# Patient Record
Sex: Female | Born: 1980 | Race: White | Hispanic: Yes | Marital: Married | Smoking: Current every day smoker
Health system: Southern US, Community
[De-identification: ages and names within clinical notes are randomized; demographics above are authoritative.]

## PROBLEM LIST (undated history)

## (undated) DIAGNOSIS — E669 Obesity, unspecified: Secondary | ICD-10-CM

## (undated) DIAGNOSIS — R7303 Prediabetes: Secondary | ICD-10-CM

## (undated) HISTORY — PX: CHOLECYSTECTOMY: SHX55

## (undated) HISTORY — PX: CARPAL TUNNEL RELEASE: SHX101

## (undated) HISTORY — DX: Obesity, unspecified: E66.9

## (undated) HISTORY — DX: Prediabetes: R73.03

---

## 2002-01-15 ENCOUNTER — Emergency Department (HOSPITAL_COMMUNITY): Admission: EM | Admit: 2002-01-15 | Discharge: 2002-01-16 | Payer: Self-pay | Admitting: Emergency Medicine

## 2002-02-08 ENCOUNTER — Ambulatory Visit (HOSPITAL_COMMUNITY): Admission: RE | Admit: 2002-02-08 | Discharge: 2002-02-08 | Payer: Self-pay | Admitting: *Deleted

## 2002-02-08 ENCOUNTER — Encounter: Payer: Self-pay | Admitting: *Deleted

## 2002-05-19 ENCOUNTER — Ambulatory Visit (HOSPITAL_COMMUNITY): Admission: RE | Admit: 2002-05-19 | Discharge: 2002-05-19 | Payer: Self-pay | Admitting: *Deleted

## 2002-05-19 ENCOUNTER — Encounter: Payer: Self-pay | Admitting: *Deleted

## 2002-06-15 ENCOUNTER — Ambulatory Visit (HOSPITAL_COMMUNITY): Admission: AD | Admit: 2002-06-15 | Discharge: 2002-06-15 | Payer: Self-pay | Admitting: *Deleted

## 2002-06-24 ENCOUNTER — Ambulatory Visit (HOSPITAL_COMMUNITY): Admission: AD | Admit: 2002-06-24 | Discharge: 2002-06-24 | Payer: Self-pay | Admitting: *Deleted

## 2002-06-27 ENCOUNTER — Inpatient Hospital Stay (HOSPITAL_COMMUNITY): Admission: AD | Admit: 2002-06-27 | Discharge: 2002-06-29 | Payer: Self-pay | Admitting: *Deleted

## 2002-10-25 ENCOUNTER — Emergency Department (HOSPITAL_COMMUNITY): Admission: EM | Admit: 2002-10-25 | Discharge: 2002-10-25 | Payer: Self-pay | Admitting: Emergency Medicine

## 2002-12-01 ENCOUNTER — Emergency Department (HOSPITAL_COMMUNITY): Admission: EM | Admit: 2002-12-01 | Discharge: 2002-12-01 | Payer: Self-pay | Admitting: Internal Medicine

## 2003-01-18 ENCOUNTER — Emergency Department (HOSPITAL_COMMUNITY): Admission: EM | Admit: 2003-01-18 | Discharge: 2003-01-18 | Payer: Self-pay | Admitting: Emergency Medicine

## 2003-02-08 ENCOUNTER — Emergency Department (HOSPITAL_COMMUNITY): Admission: EM | Admit: 2003-02-08 | Discharge: 2003-02-09 | Payer: Self-pay | Admitting: Emergency Medicine

## 2003-02-09 ENCOUNTER — Ambulatory Visit (HOSPITAL_COMMUNITY): Admission: RE | Admit: 2003-02-09 | Discharge: 2003-02-09 | Payer: Self-pay | Admitting: Emergency Medicine

## 2003-02-09 ENCOUNTER — Encounter: Payer: Self-pay | Admitting: Emergency Medicine

## 2003-02-11 ENCOUNTER — Emergency Department (HOSPITAL_COMMUNITY): Admission: EM | Admit: 2003-02-11 | Discharge: 2003-02-11 | Payer: Self-pay | Admitting: Internal Medicine

## 2003-02-13 ENCOUNTER — Emergency Department (HOSPITAL_COMMUNITY): Admission: EM | Admit: 2003-02-13 | Discharge: 2003-02-13 | Payer: Self-pay | Admitting: Emergency Medicine

## 2003-02-15 ENCOUNTER — Ambulatory Visit (HOSPITAL_COMMUNITY): Admission: RE | Admit: 2003-02-15 | Discharge: 2003-02-15 | Payer: Self-pay | Admitting: Family Medicine

## 2003-02-15 ENCOUNTER — Encounter: Payer: Self-pay | Admitting: Family Medicine

## 2003-03-14 ENCOUNTER — Inpatient Hospital Stay (HOSPITAL_COMMUNITY): Admission: RE | Admit: 2003-03-14 | Discharge: 2003-03-15 | Payer: Self-pay | Admitting: General Surgery

## 2003-03-23 ENCOUNTER — Emergency Department (HOSPITAL_COMMUNITY): Admission: EM | Admit: 2003-03-23 | Discharge: 2003-03-24 | Payer: Self-pay | Admitting: Emergency Medicine

## 2003-06-01 ENCOUNTER — Emergency Department (HOSPITAL_COMMUNITY): Admission: EM | Admit: 2003-06-01 | Discharge: 2003-06-01 | Payer: Self-pay | Admitting: Emergency Medicine

## 2003-06-21 ENCOUNTER — Emergency Department (HOSPITAL_COMMUNITY): Admission: EM | Admit: 2003-06-21 | Discharge: 2003-06-22 | Payer: Self-pay | Admitting: Emergency Medicine

## 2003-10-03 ENCOUNTER — Emergency Department (HOSPITAL_COMMUNITY): Admission: EM | Admit: 2003-10-03 | Discharge: 2003-10-03 | Payer: Self-pay | Admitting: Emergency Medicine

## 2003-10-03 ENCOUNTER — Ambulatory Visit (HOSPITAL_COMMUNITY): Admission: RE | Admit: 2003-10-03 | Discharge: 2003-10-03 | Payer: Self-pay | Admitting: Emergency Medicine

## 2003-10-17 ENCOUNTER — Emergency Department (HOSPITAL_COMMUNITY): Admission: EM | Admit: 2003-10-17 | Discharge: 2003-10-18 | Payer: Self-pay | Admitting: Emergency Medicine

## 2004-01-29 ENCOUNTER — Emergency Department (HOSPITAL_COMMUNITY): Admission: EM | Admit: 2004-01-29 | Discharge: 2004-01-30 | Payer: Self-pay | Admitting: *Deleted

## 2004-04-15 ENCOUNTER — Emergency Department (HOSPITAL_COMMUNITY): Admission: EM | Admit: 2004-04-15 | Discharge: 2004-04-15 | Payer: Self-pay | Admitting: Emergency Medicine

## 2004-05-13 ENCOUNTER — Emergency Department (HOSPITAL_COMMUNITY): Admission: EM | Admit: 2004-05-13 | Discharge: 2004-05-13 | Payer: Self-pay | Admitting: Emergency Medicine

## 2004-06-04 ENCOUNTER — Emergency Department (HOSPITAL_COMMUNITY): Admission: EM | Admit: 2004-06-04 | Discharge: 2004-06-04 | Payer: Self-pay | Admitting: Emergency Medicine

## 2004-07-26 ENCOUNTER — Emergency Department (HOSPITAL_COMMUNITY): Admission: EM | Admit: 2004-07-26 | Discharge: 2004-07-27 | Payer: Self-pay | Admitting: Emergency Medicine

## 2004-08-08 ENCOUNTER — Emergency Department (HOSPITAL_COMMUNITY): Admission: EM | Admit: 2004-08-08 | Discharge: 2004-08-08 | Payer: Self-pay | Admitting: Emergency Medicine

## 2005-04-04 ENCOUNTER — Emergency Department (HOSPITAL_COMMUNITY): Admission: EM | Admit: 2005-04-04 | Discharge: 2005-04-04 | Payer: Self-pay | Admitting: Emergency Medicine

## 2005-04-19 ENCOUNTER — Emergency Department (HOSPITAL_COMMUNITY): Admission: EM | Admit: 2005-04-19 | Discharge: 2005-04-19 | Payer: Self-pay | Admitting: Emergency Medicine

## 2005-06-05 ENCOUNTER — Emergency Department (HOSPITAL_COMMUNITY): Admission: EM | Admit: 2005-06-05 | Discharge: 2005-06-05 | Payer: Self-pay | Admitting: Emergency Medicine

## 2005-06-29 ENCOUNTER — Emergency Department (HOSPITAL_COMMUNITY): Admission: EM | Admit: 2005-06-29 | Discharge: 2005-06-29 | Payer: Self-pay | Admitting: Emergency Medicine

## 2005-09-04 ENCOUNTER — Ambulatory Visit (HOSPITAL_COMMUNITY): Admission: RE | Admit: 2005-09-04 | Discharge: 2005-09-04 | Payer: Self-pay | Admitting: Obstetrics and Gynecology

## 2005-09-14 ENCOUNTER — Emergency Department (HOSPITAL_COMMUNITY): Admission: EM | Admit: 2005-09-14 | Discharge: 2005-09-14 | Payer: Self-pay | Admitting: Emergency Medicine

## 2005-09-16 ENCOUNTER — Emergency Department (HOSPITAL_COMMUNITY): Admission: EM | Admit: 2005-09-16 | Discharge: 2005-09-16 | Payer: Self-pay | Admitting: Emergency Medicine

## 2005-10-09 ENCOUNTER — Emergency Department (HOSPITAL_COMMUNITY): Admission: EM | Admit: 2005-10-09 | Discharge: 2005-10-09 | Payer: Self-pay | Admitting: Emergency Medicine

## 2005-11-17 ENCOUNTER — Ambulatory Visit: Payer: Self-pay | Admitting: Orthopedic Surgery

## 2005-12-22 ENCOUNTER — Emergency Department (HOSPITAL_COMMUNITY): Admission: EM | Admit: 2005-12-22 | Discharge: 2005-12-22 | Payer: Self-pay | Admitting: Emergency Medicine

## 2005-12-29 ENCOUNTER — Ambulatory Visit: Payer: Self-pay | Admitting: Orthopedic Surgery

## 2006-01-12 ENCOUNTER — Ambulatory Visit: Payer: Self-pay | Admitting: Orthopedic Surgery

## 2006-03-19 ENCOUNTER — Ambulatory Visit: Payer: Self-pay | Admitting: Orthopedic Surgery

## 2006-04-03 ENCOUNTER — Ambulatory Visit: Payer: Self-pay | Admitting: Orthopedic Surgery

## 2006-04-03 ENCOUNTER — Ambulatory Visit (HOSPITAL_COMMUNITY): Admission: RE | Admit: 2006-04-03 | Discharge: 2006-04-03 | Payer: Self-pay | Admitting: Orthopedic Surgery

## 2006-04-07 ENCOUNTER — Ambulatory Visit: Payer: Self-pay | Admitting: Orthopedic Surgery

## 2006-04-15 ENCOUNTER — Ambulatory Visit: Payer: Self-pay | Admitting: Orthopedic Surgery

## 2006-05-28 ENCOUNTER — Ambulatory Visit: Payer: Self-pay | Admitting: Orthopedic Surgery

## 2006-07-16 ENCOUNTER — Emergency Department (HOSPITAL_COMMUNITY): Admission: EM | Admit: 2006-07-16 | Discharge: 2006-07-16 | Payer: Self-pay | Admitting: Emergency Medicine

## 2006-10-15 ENCOUNTER — Ambulatory Visit (HOSPITAL_COMMUNITY): Admission: AD | Admit: 2006-10-15 | Discharge: 2006-10-15 | Payer: Self-pay | Admitting: Obstetrics and Gynecology

## 2006-11-21 ENCOUNTER — Ambulatory Visit: Payer: Self-pay | Admitting: Obstetrics and Gynecology

## 2006-11-21 ENCOUNTER — Inpatient Hospital Stay (HOSPITAL_COMMUNITY): Admission: AD | Admit: 2006-11-21 | Discharge: 2006-11-21 | Payer: Self-pay | Admitting: Obstetrics and Gynecology

## 2006-11-29 ENCOUNTER — Inpatient Hospital Stay (HOSPITAL_COMMUNITY): Admission: AD | Admit: 2006-11-29 | Discharge: 2006-11-29 | Payer: Self-pay | Admitting: Obstetrics & Gynecology

## 2006-12-03 ENCOUNTER — Inpatient Hospital Stay (HOSPITAL_COMMUNITY): Admission: AD | Admit: 2006-12-03 | Discharge: 2006-12-03 | Payer: Self-pay | Admitting: Obstetrics and Gynecology

## 2006-12-03 ENCOUNTER — Ambulatory Visit: Payer: Self-pay | Admitting: Obstetrics and Gynecology

## 2006-12-05 ENCOUNTER — Ambulatory Visit: Payer: Self-pay | Admitting: Obstetrics and Gynecology

## 2006-12-05 ENCOUNTER — Inpatient Hospital Stay (HOSPITAL_COMMUNITY): Admission: AD | Admit: 2006-12-05 | Discharge: 2006-12-06 | Payer: Self-pay | Admitting: Obstetrics and Gynecology

## 2007-03-08 ENCOUNTER — Emergency Department (HOSPITAL_COMMUNITY): Admission: EM | Admit: 2007-03-08 | Discharge: 2007-03-08 | Payer: Self-pay | Admitting: Emergency Medicine

## 2008-03-15 ENCOUNTER — Ambulatory Visit (HOSPITAL_COMMUNITY): Admission: RE | Admit: 2008-03-15 | Discharge: 2008-03-15 | Payer: Self-pay | Admitting: Obstetrics & Gynecology

## 2008-06-26 ENCOUNTER — Emergency Department (HOSPITAL_COMMUNITY): Admission: EM | Admit: 2008-06-26 | Discharge: 2008-06-26 | Payer: Self-pay | Admitting: Emergency Medicine

## 2009-02-22 ENCOUNTER — Encounter (INDEPENDENT_AMBULATORY_CARE_PROVIDER_SITE_OTHER): Payer: Self-pay | Admitting: *Deleted

## 2009-03-21 ENCOUNTER — Ambulatory Visit: Payer: Self-pay | Admitting: Gastroenterology

## 2009-03-21 DIAGNOSIS — K219 Gastro-esophageal reflux disease without esophagitis: Secondary | ICD-10-CM

## 2009-03-21 DIAGNOSIS — R197 Diarrhea, unspecified: Secondary | ICD-10-CM | POA: Insufficient documentation

## 2009-03-22 ENCOUNTER — Encounter: Payer: Self-pay | Admitting: Internal Medicine

## 2009-03-26 ENCOUNTER — Ambulatory Visit (HOSPITAL_COMMUNITY): Admission: RE | Admit: 2009-03-26 | Discharge: 2009-03-26 | Payer: Self-pay | Admitting: Internal Medicine

## 2009-03-26 ENCOUNTER — Ambulatory Visit: Payer: Self-pay | Admitting: Internal Medicine

## 2009-05-09 ENCOUNTER — Ambulatory Visit: Payer: Self-pay | Admitting: Internal Medicine

## 2009-05-09 DIAGNOSIS — R112 Nausea with vomiting, unspecified: Secondary | ICD-10-CM

## 2009-05-09 DIAGNOSIS — R1013 Epigastric pain: Secondary | ICD-10-CM | POA: Insufficient documentation

## 2009-05-09 DIAGNOSIS — R1310 Dysphagia, unspecified: Secondary | ICD-10-CM | POA: Insufficient documentation

## 2009-05-09 DIAGNOSIS — R1011 Right upper quadrant pain: Secondary | ICD-10-CM | POA: Insufficient documentation

## 2009-05-17 ENCOUNTER — Encounter: Payer: Self-pay | Admitting: Gastroenterology

## 2009-05-18 ENCOUNTER — Ambulatory Visit (HOSPITAL_COMMUNITY): Admission: RE | Admit: 2009-05-18 | Discharge: 2009-05-18 | Payer: Self-pay | Admitting: Internal Medicine

## 2009-05-18 LAB — CONVERTED CEMR LAB
ALT: 30 units/L (ref 0–35)
AST: 21 units/L (ref 0–37)
Bilirubin, Direct: <0.1 mg/dL (ref 0.0–0.3)
Eosinophils Relative: 1 % (ref 0–5)
HCT: 41.6 % (ref 36.0–46.0)
Hemoglobin: 13.8 g/dL (ref 12.0–15.0)
Lymphocytes Relative: 42 % (ref 12–46)
Lymphs Abs: 3.5 10*3/uL (ref 0.7–4.0)
Monocytes Absolute: 0.4 10*3/uL (ref 0.1–1.0)
RDW: 13.2 % (ref 11.5–15.5)

## 2009-05-22 ENCOUNTER — Encounter: Payer: Self-pay | Admitting: Internal Medicine

## 2009-05-25 ENCOUNTER — Encounter: Payer: Self-pay | Admitting: Internal Medicine

## 2009-05-30 ENCOUNTER — Encounter: Payer: Self-pay | Admitting: Internal Medicine

## 2009-08-29 ENCOUNTER — Encounter: Payer: Self-pay | Admitting: Internal Medicine

## 2010-04-11 IMAGING — CT CT ABD-PELV W/ CM
2 of 3 series · 17 of 46 positions shown, 19 images · IV contrast (Omnipaque 300)
Comparison: None

CLINICAL DATA: Right flank and abdominal pain.  Nausea and
vomiting.

CT ABDOMEN AND PELVIS WITH CONTRAST
TECHNIQUE: Multidetector CT imaging of the abdomen and pelvis was
performed following the standard protocol during bolus
administration of intravenous contrast.
Contrast: 100 ml 5mnipaque-6MM

[Series 2: abd_pel_with 5.0 b40f · axial · 0.64mm/px · z∈[-453,-68]mm · 14 of 89 slices shown, 16 images]
[im 6/89  soft-tissue]
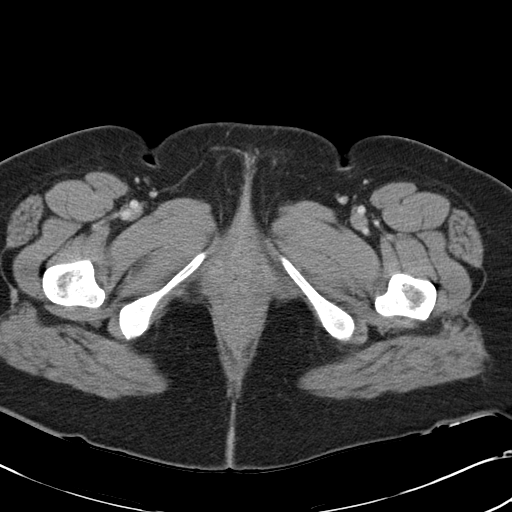
[im 6/89  bone]
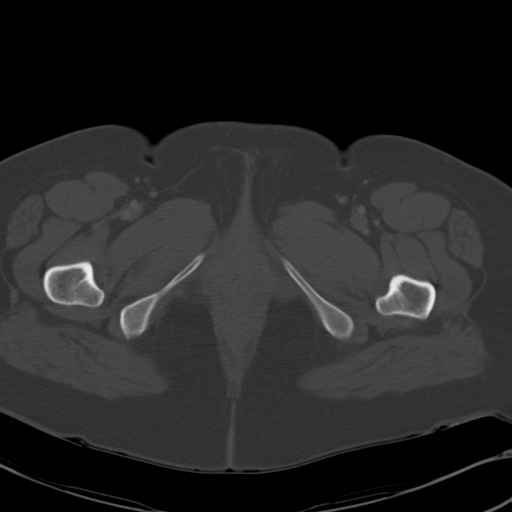
[im 12/89  soft-tissue]
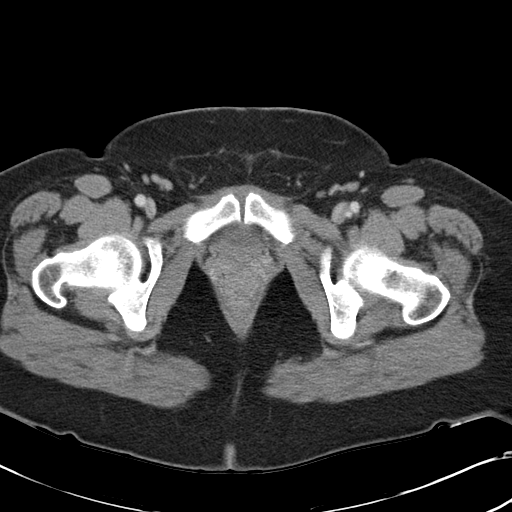
[im 18/89  soft-tissue]
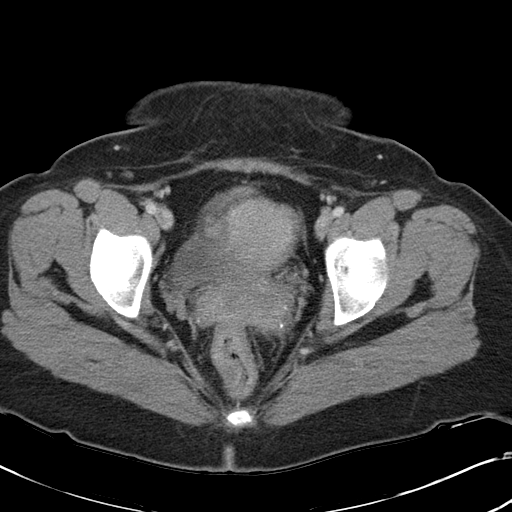
[im 23/89  soft-tissue]
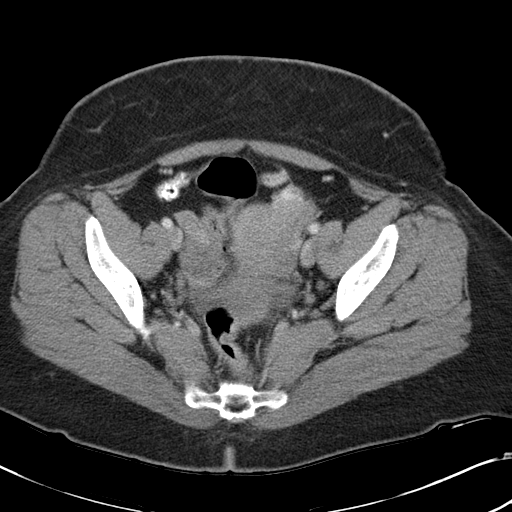
[im 29/89  soft-tissue]
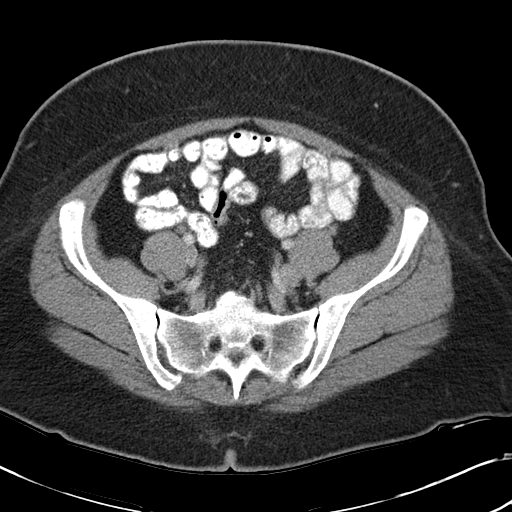
[im 35/89  soft-tissue]
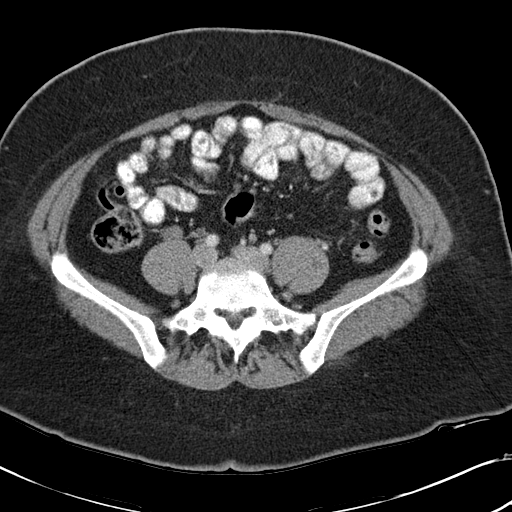
[im 40/89  soft-tissue]
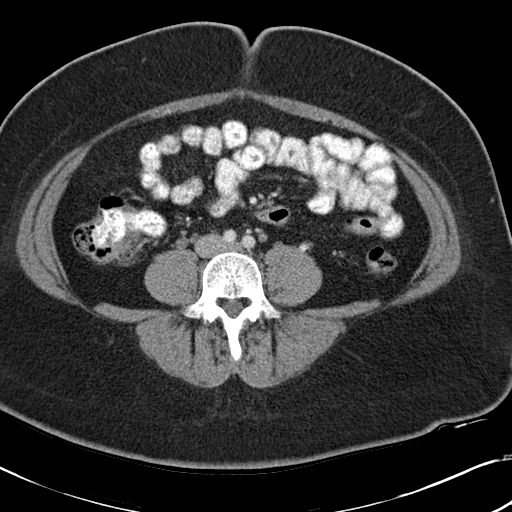
[im 49/89  soft-tissue]
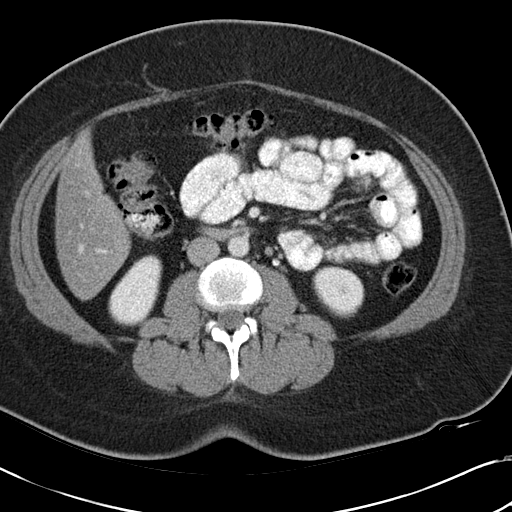
[im 54/89  soft-tissue]
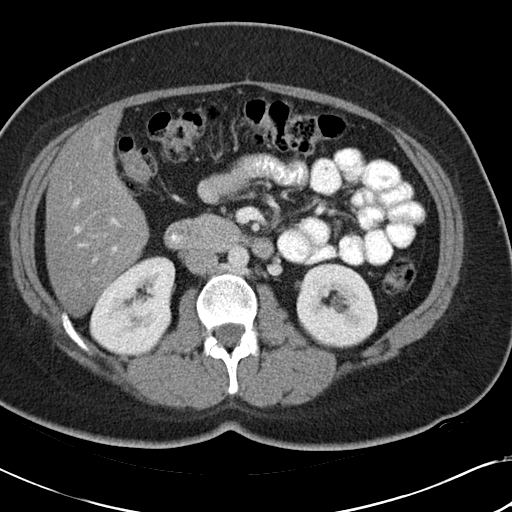
[im 54/89  bone]
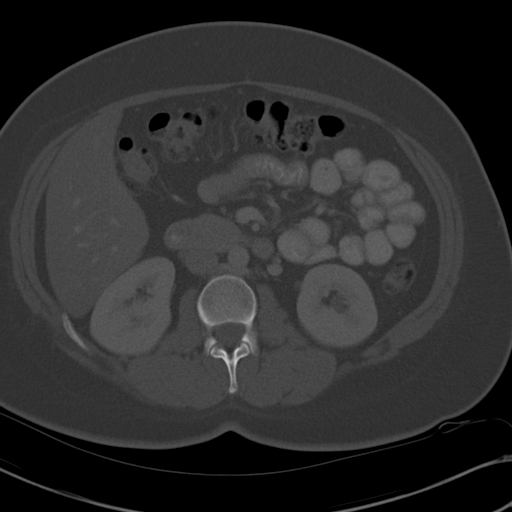
[im 60/89  soft-tissue]
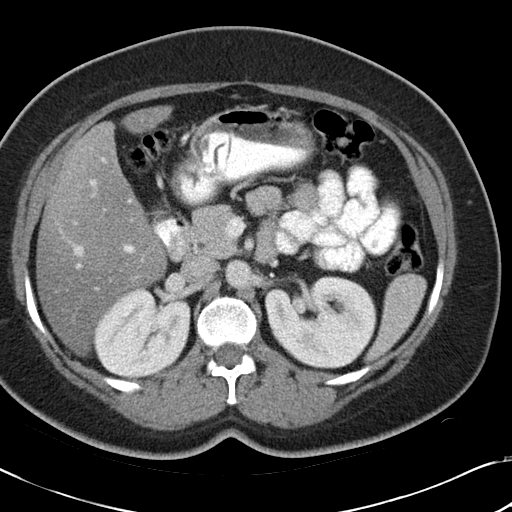
[im 66/89  soft-tissue]
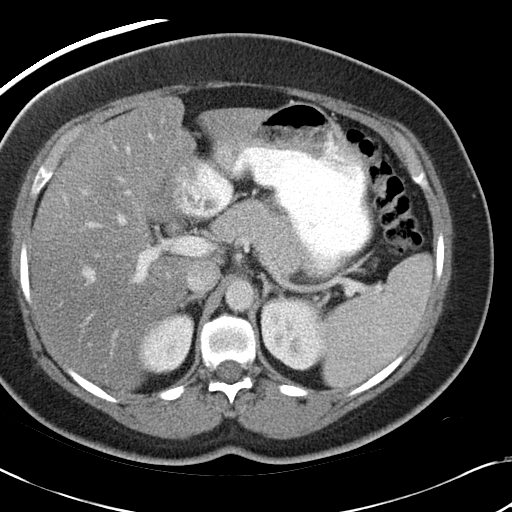
[im 71/89  soft-tissue]
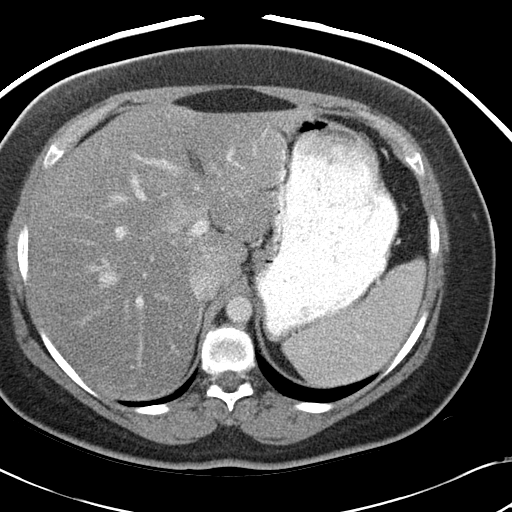
[im 77/89  soft-tissue]
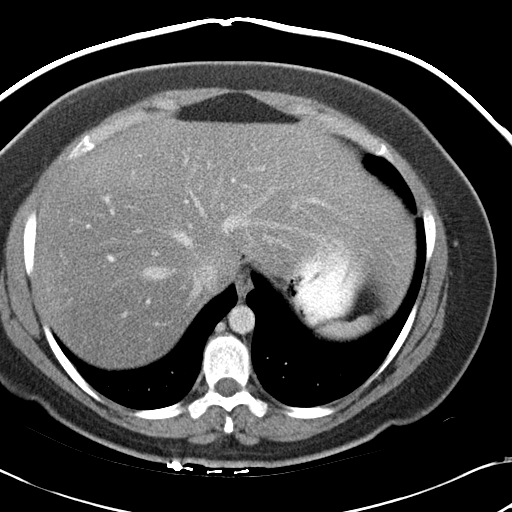
[im 83/89  soft-tissue]
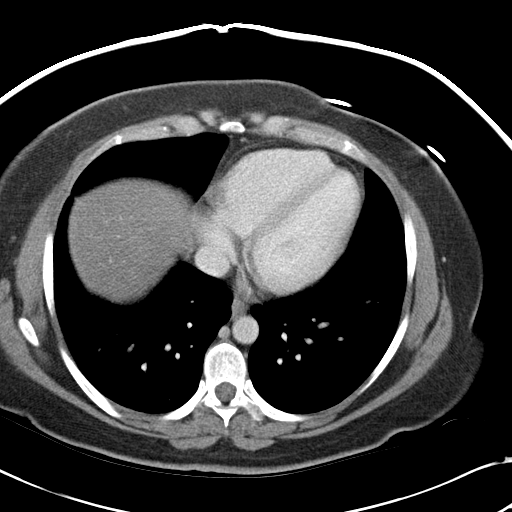

[Series 4: mpr cor post contrast (id) · coronal · 0.79mm/px · 3 of 93 slices shown]
[im 31/93  soft-tissue]
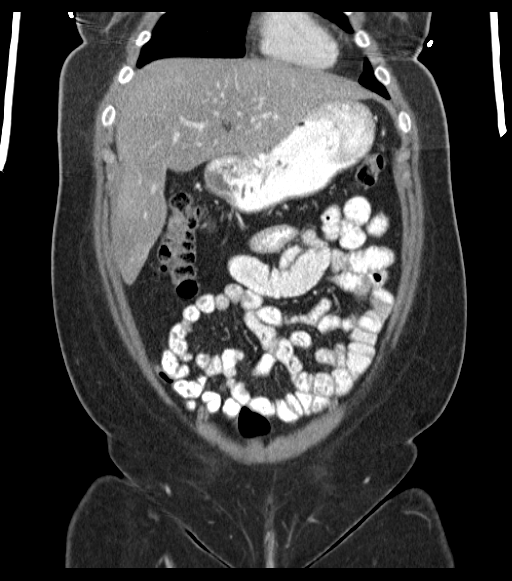
[im 41/93  soft-tissue]
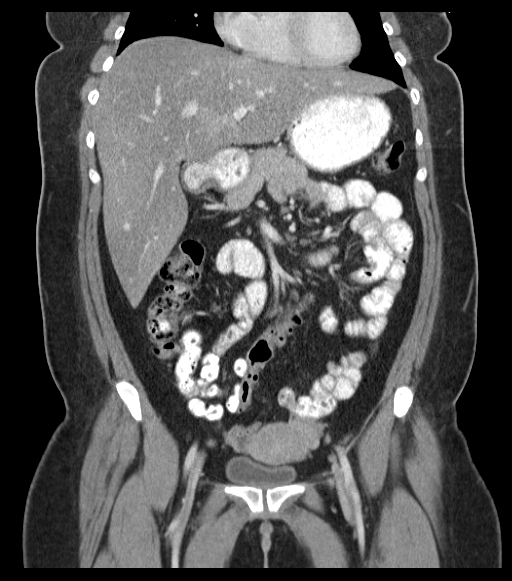
[im 52/93  soft-tissue]
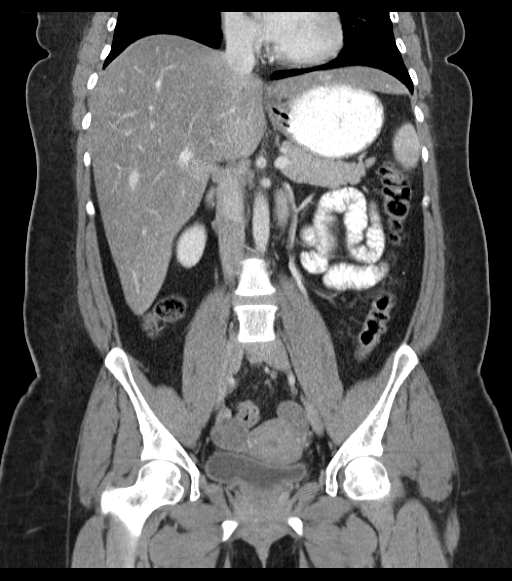

[17 of 46 positions shown; findings below may reference images not displayed]

FINDINGS: Diffuse hepatic steatosis is present.  There is some
minimal focal fatty sparing in the medial segment left hepatic lobe
along the porta hepatis.  The gallbladder is surgically absent.

The spleen, pancreas, adrenal glands, and stomach appear
unremarkable.

The kidneys appear unremarkable, as do the proximal ureters.

No pathologic retroperitoneal or porta hepatis adenopathy is
identified.

The appendix appears normal.  No dilated bowel identified.

Urinary bladder appears normal, as does the uterus.  Ovarian
contours likewise appear normal.

No free pelvic fluid identified. No pathologic pelvic adenopathy is
identified.

There appears to be a transitional S1 vertebra.

No ureteral calculus identified.
IMPRESSION: 1.  Diffuse hepatic steatosis.
2.  No acute findings.

## 2010-05-26 ENCOUNTER — Encounter: Payer: Self-pay | Admitting: Obstetrics and Gynecology

## 2010-06-04 NOTE — Letter (Signed)
Summary: External Other  External Other   Imported By: Peggyann Shoals 08/29/2009 10:21:04  _____________________________________________________________________  External Attachment:    Type:   Image     Comment:   External Document

## 2010-06-04 NOTE — Letter (Signed)
Summary: [facility]   Imported By: Diana Eves 05/30/2009 15:36:29  _____________________________________________________________________  External Attachment:    Type:   Image     Comment:   External Document  Appended Document: [facility] CALLED AND SAID THEY WERE UNABLE TO RETRIEVE THE DATA OFF THE MEMORY CARD. THEY HAD TO SEND IT TO THE COMPANY TO GET IT, AND THEY WILL HAVE [place] A REPORT WHEN THEY GET IT BACK.

## 2010-06-04 NOTE — Letter (Signed)
Summary: [facility] REFERRAL  [facility] REFERRAL   Imported By: Ave Filter 05/25/2009 12:40:15  _____________________________________________________________________  External Attachment:    Type:   Image     Comment:   External Document

## 2010-06-04 NOTE — Assessment & Plan Note (Signed)
Summary: follow up from procedure- cdg   Visit Type:  f/u Primary Care Provider:  Dr. Acey Lav  Chief Complaint:  follow up from procedure, hurting in chest started this week, and having nausea and vomiting.  History of Present Illness: [name] is a pleasant 30 year old white female who presents for f/u of recent upper endoscopy done for refractory reflux-like symptoms. On EGD she had a noncritical Schatzki ring, small hiatal hernia but otherwise unremarkable study.  She has noted no improvement on Aciphex.  She c/o pain and burning in upper abdomen which radiates into the chest. She c/o N/V several times over the past few days.  C/O odynophagia with swallowing food or water.  No hematemesis.  BMs less frequent with hyomax.  Denies melena, brbpr.    Current Medications (verified): 1)  Aciphex 20 Mg Tbec (Rabeprazole Sodium) .... One By Mouth Daily 2)  Zyrtec Hives Relief 10 Mg Tabs (Cetirizine Hcl) .... Take 1 Tablet By Mouth Once A Day As Needed 3)  Hyomax-Sr 0.375 Mg Xr12h-Tab (Hyoscyamine Sulfate) .... One By Mouth Two Times A Day For Diarrhea  Allergies (verified): 1)  ! Hydrocodone  Review of Systems      See HPI CV:  Denies chest pains, angina, and palpitations. Resp:  Denies dyspnea at rest, dyspnea with exercise, cough, sputum, and wheezing.  Vital Signs:  Patient profile:   30 year old female Height:      55 inches Weight:      165 pounds BMI:     38.49 Temp:     98.5 degrees F oral Pulse rate:   80 / minute BP sitting:   104 / 70  (left arm) Cuff size:   regular  Vitals Entered By: Hendricks Limes LPN (May 09, 2009 2:41 PM)  Physical Exam  General:  Well developed, well nourished, no acute distress.obese.   Eyes:  Sclera nonicteric. Mouth:  OP moist. Lungs:  Clear throughout to auscultation. Heart:  Regular rate and rhythm; no murmurs, rubs,  or bruits. Abdomen:  Obese. Positive BSs. Diffuse mild tenderness to even light palpation.  Less tenderness  noted with distraction.  Most tenderness in ruq and epigastrium. No abd bruit or hernia. No rebound or guarding. Extremities:  No clubbing, cyanosis, edema or deformities noted. Neurologic:  Alert and  oriented x4;  grossly normal neurologically. Skin:  Intact without significant lesions or rashes. Psych:  Alert and cooperative. Normal mood and affect.  Impression & Recommendations:  Problem # 1:  EPIGASTRIC PAIN (ICD-789.06) Epigastric and ruq abd pain radiating into chest and throat.  Described as pain and burning.  No response to Aciphex, Nexium, Prilosec, Prevacid.  ?refractory gerd or nonacid reflux, eosinophilic esophagitis/gastroenteritis (less likely), ?biliary or pancreatic disease.  If labwork unremarkable, consider referral for pH and impedence study on PPI therapy.  If abd pain, n/v worsen may need to consider CT. Will increase Aciphex to two times a day (samples provided #30) and add Carafate for two weeks.  Problem # 2:  DYSPHAGIA UNSPECIFIED (ICD-787.20) C/O odynophagia.  See #2  Problem # 3:  NAUSEA WITH VOMITING (ICD-787.01) see #1  Problem # 4:  DIARRHEA, CHRONIC (ICD-787.91) ?IBS.  Better on antispasmotic, uses as needed.  Other Orders: Est. Patient Level III ([address]) T-Amylase [phone]) T-Lipase [phone]) T-Hepatic Function [phone]) T-CBC w/Diff [phone]) Prescriptions: SUCRALFATE 1 GM/10ML SUSP (SUCRALFATE) one gram by mouth qac and at bedtime as needed for two weeks  #560 cc x 0   Entered and Authorized  by:   Leanna Battles Dixon Boos   Signed by:   Leanna Battles Zya Finkle PA-C on 05/09/2009   Method used:   Electronically to        [facility]. [phone] (retail)       [address] [place]  [address]       Ph: [phone]       Fax: [phone]   RxID:   [id number]

## 2010-06-04 NOTE — Letter (Signed)
Summary: CT SCAN/CXR ORDER  CT SCAN/CXR ORDER   Imported By: Ave Filter 05/22/2009 12:53:56  _____________________________________________________________________  External Attachment:    Type:   Image     Comment:   External Document

## 2010-09-17 NOTE — Op Note (Signed)
NAMEGildardo Knapp NO.:  [id number]   MEDICAL RECORD NO.:  [id number]          PATIENT TYPE:  OBV   LOCATION:  A415                          FACILITY:  [facility]   PHYSICIAN:  Tilda Burrow, M.D. DATE OF BIRTH:  1980/07/14   DATE OF PROCEDURE:  10/15/2006  DATE OF DISCHARGE:  10/15/2006                               OPERATIVE REPORT   Observation x2 hours.  This 30 year old Hispanic female 2 days after  being seen in the office for some inguinal pain on the right has noted  the pain has increased.  She presents to Labor & Delivery where external  monitoring shows no contractions.  Urinalysis is not done.  It was  negative in the office on June 11.  She has right inguinal pain without  bulge or mass, the femoral triangle is nontender, there is no leg  swelling or pain with movement of leg.  Uterus is nontender.  She was  given a dose of terbutaline due to 3 contractions noted on the monitor  __________ .   IMPRESSION:  Musculoskeletal discomfort only, do not __identify__ deep  vein thrombosis or obstetric problem.   Followup as scheduled.  A prescription given for some Vicodin for  discomfort.  Followup in our office as scheduled, earlier as needed  change in symptoms.      Tilda Burrow, M.D.  Electronically Signed     JVF/MEDQ  D:  10/15/2006  T:  10/16/2006  Job:  [id number]

## 2010-09-17 NOTE — Consult Note (Signed)
NAMESHARITA, BIENAIME NO.:  [id number]   MEDICAL RECORD NO.:  [id number]          PATIENT TYPE:  MAT   LOCATION:  MATC                          FACILITY:  [facility]   PHYSICIAN:  Tilda Burrow, M.D. DATE OF BIRTH:  01-26-1981   DATE OF CONSULTATION:  DATE OF DISCHARGE:                                 CONSULTATION   Tonya Knapp is [redacted] weeks pregnant with her fourth baby, came into the MAU on  November 20, 2006 and was kept for a couple of hours due to complaints of a  lot of pelvic pressure and back pain. She is not having any  contractions. Her cervix was 1 cm, very thick, minus 2 station. Fetal  heart rate was reactive without decelerations.   She was checked again a couple of hours later and there was no change to  her cervix. She was given instructions on palliative measures to help  with the pains of pregnancy such as a maternity belt, a warm bath,  Tylenol. She has been taking Vicodin p.r.n. for the same type of pain.  She states that she is just sick of taking medicine, so she does not  want anything else.      Tonya Knapp, C.N.M.      Tilda Burrow, M.D.  Electronically Signed    FC/MEDQ  D:  11/21/2006  T:  11/21/2006  Job:  [id number]

## 2010-09-20 NOTE — H&P (Signed)
NAME:  Tonya Knapp, WALCZYK NO.:  [id number]   MEDICAL RECORD NO.:  [id number]                  PATIENT TYPE:   LOCATION:                                       FACILITY:   PHYSICIAN:  Langley Gauss, M.D.                DATE OF BIRTH:   DATE OF ADMISSION:  06/27/2002  DATE OF DISCHARGE:                                HISTORY & PHYSICAL   HISTORY OF PRESENT ILLNESS:  This is a 30 year old gravida 3, para 2 at 54-  1/[redacted] weeks gestation, who is seen in the office for a scheduled visit today,  at which time she complains of pelvic and perineal pressure and on  examination, has been noted to make cervical change to 3 cm dilated,  70%  effaced, minus 1 station and vertex presentation.  She is also referred to  [facility] with a diagnosis of early labor.  She does have other  childcare responsibilities and thus, will not be able to go immediately to  labor and delivery.   PRENATAL COURSE:  The patient's prenatal course has been uncomplicated.  The  culture is negative.  AFB triple screen within normal limits.  Glucose  tolerance test normal, two previous pregnancies, thus not repeated during  this pregnancy.  She is O negative blood type and did receive RhoGAM  injection on 05/03/02.  The patient does have serial ultrasound which have  documented adequate fetal growth and normal anatomic survey.   PAST OB HISTORY:  Vaginal delivery at [redacted] weeks gestation of a 5 pound, 4.1  ounce infant.  Vaginal delivery at [redacted] weeks gestation of a 5 pound, 1.1  ounce infant.  The patient had no problems with either prenatal course,  pregnancy or delivery.   PAST MEDICAL/SURGICAL HISTORY:  Otherwise, negative.   ALLERGIES:  No known drug allergies.   CURRENT MEDICATIONS:  Prenatal vitamins.   SOCIAL HISTORY:  The patient was employed at the onset of the pregnancy at  [employer].  She is currently not working.  She did smoke less than one  half pack per day at  the onset of the pregnancy planned on quitting during  the pregnancy.   PHYSICAL EXAMINATION:  GENERAL:  In no acute distress.  Height is 4 feet, 7  inches.  Pregnancy weight 146; most recent weight 147.  VITAL SIGNS:  Blood pressure 110/76, pulse rate 80, respiratory rate 20.  HEENT:  Negative, no adenopathy.  NECK:  Supple.  Thyroid is nonpalpable.  LUNGS:  Clear.  CARDIOVASCULAR:  Regular rate and rhythm.  ABDOMEN:  Soft and nontender.  No surgical scars were identified.  She is  vertex presentation by Leopold's maneuver.  Fundal height is 34 cm.  Heart  rate auscultated at 152.  EXTREMITIES:  Normal with no significant edema.  PELVIC:  Normal external genitalia.  No lesions or ulcerations identified.  No fluid or vaginal bleeding.  Cervix as stated previously, 3 cm dilated.  Pelvis is known to be clinically adequate by clinical pelvimetry.   ASSESSMENT AND PLAN:  Gravida 3, para 2 with very acceptable cervix.  The  patient likely in early stages of labor.  Referred to [facility],  at which time she will be admitted and monitoring performed.  Thereafter,  amniotomy performed.  The patient will thereafter be monitored for adequacy  of uterine contractions and cervical change.  The patient does plan on  breastfeeding.  She would like to utilize the patch for postpartum birth  control purposes.  She had previously done well on contraceptive patch and  done well.  She plans on utilizing Dr. Sudie Bailey for newborn pediatric care.                                               Langley Gauss, M.D.    DC/MEDQ  D:  06/27/2002  T:  06/27/2002  Job:  [id number]

## 2010-09-20 NOTE — Op Note (Signed)
   NAME:  Tonya Knapp                       ACCOUNT NO.:  [id number]   MEDICAL RECORD NO.:  [id number]                   PATIENT TYPE:  INP   LOCATION:  A330                                 FACILITY:  [facility]   PHYSICIAN:  Dirk Dress. Katrinka Blazing, M.D.                DATE OF BIRTH:  August 23, 1980   DATE OF PROCEDURE:  DATE OF DISCHARGE:                                 OPERATIVE REPORT   PREOPERATIVE DIAGNOSES:  1. Cholelithiasis.  2. Cholecystitis.   POSTOPERATIVE DIAGNOSES:  1. Cholelithiasis.  2. Cholecystitis.   PROCEDURE:  Laparoscopic cholecystectomy.   SURGEON:  Dirk Dress. Katrinka Blazing, M.D.   DESCRIPTION:  Under general anesthesia the patient's abdomen was prepped and  draped in a sterile field.  A supraumbilical incision was made and a Veress  needle was then inserted uneventfully.  The abdomen was insufflated with 3 L  of CO2.  Using a Visiport guide, a 10-mm port was placed uneventfully.  A  laparoscope was placed, and the gallbladder was visualized.  Under  videoscopic guidance a 10-mm port and two 5-mm ports were placed in the  right upper quadrant.  The gallbladder was grasped by the assistant and  positioned.  The cystic duct was dissected, clipped with four clips, and  divided.  The cystic artery had two branches.  Each was clipped with three  clips and divided.  Using electrocautery, the gallbladder was then separated  from the infrahepatic space without difficulty.  The gallbladder was grasped  and retrieved.  There was no bleeding or bile leak from the bed.  The fluid  was totally clear with irrigation.  The CO2 was allowed to escape from the  abdomen, and the ports were removed.  The two larger incisions were closed  with 0-Dexon on the fascia, and all incisions were closed with staples on  the skin.  The patient tolerated the procedure well.  She was awakened from  anesthesia uneventfully, transferred to a bed, and taken to the post  anesthesia care unit.      ___________________________________________                                            Dirk Dress. Katrinka Blazing, M.D.   LCS/MEDQ  D:  03/14/2003  T:  03/14/2003  Job:  [id number]   cc:   Mila Homer. Sudie Bailey, M.D.  [address] [place], [place] [address]  Fax: [phone]

## 2010-09-20 NOTE — H&P (Signed)
   NAMEGildardo Knapp NO.:  [id number]   MEDICAL RECORD NO.:  [id number]                  PATIENT TYPE:   LOCATION:                                       FACILITY:  [facility]   PHYSICIAN:  Dirk Dress. Katrinka Blazing, M.D.                DATE OF BIRTH:  13-Mar-1981   DATE OF ADMISSION:  DATE OF DISCHARGE:                                HISTORY & PHYSICAL   HISTORY OF PRESENT ILLNESS:  [age]-year-old female with greater than a  five-year history of recurrent severe abdominal pain.  The pain has been  much more severe over the past three weeks.  She was evaluated and found to  have a gallbladder filled with stones.  She remained asymptomatic with  nausea, vomiting and diarrhea.  The pain radiates through to her back.  Liver functions were normal.  The patient has been referred for  cholecystectomy.   PAST HISTORY:  She has seasonal allergies.   MEDICATIONS:  1. Zyrtec 10 mg daily.  2. Tylenol No. 3 p.r.n. for pain.   ALLERGIES:  No known drug allergies.   FAMILY HISTORY:  Family history is positive for diabetes mellitus, breast  cancer and gallstone disease.   SOCIAL HISTORY:  She is single parent with three children.  She works part-  time in [employer].   PHYSICAL EXAMINATION:  VITAL SIGNS:  Blood pressure 108/82, pulse 78,  respirations 20.  Weight 133 pounds.  HEENT:  Unremarkable.  There is no jaundice.  NECK:  Neck is supple without JVD or bruit.  CHEST:  Chest clear to auscultation.  HEART:  Regular rate and rhythm without murmur, gallop or rub.  ABDOMEN:  Abdomen soft.  Moderate epigastric and right upper quadrant  tenderness.  Normal bowel sounds.  EXTREMITIES:  No cyanosis, clubbing or edema.  NEUROLOGIC:  No focal motor, sensory or cerebellar deficit.   IMPRESSION:  1. Cholelithiasis/cholecystitis.  2. Seasonal allergies.   PLAN:  Laparoscopic cholecystectomy.    ___________________________________________               Dirk Dress Katrinka Blazing, M.D.   LCS/MEDQ  D:  03/13/2003  T:  03/14/2003  Job:  [id number]

## 2010-09-20 NOTE — Discharge Summary (Signed)
   NAME:  Tonya Knapp, Tonya Knapp                       ACCOUNT NO.:  [id number]   MEDICAL RECORD NO.:  [id number]                   PATIENT TYPE:  INP   LOCATION:  A426                                 FACILITY:  [facility]   PHYSICIAN:  Langley Gauss, M.D.                DATE OF BIRTH:  1980/12/12   DATE OF ADMISSION:  06/27/2002  DATE OF DISCHARGE:  06/29/2002                                 DISCHARGE SUMMARY   DIAGNOSIS:  A 38+ week intrauterine pregnancy.   PROCEDURE:  1. Placement of continuous lumbar epidural analgesia on 06/27/2002.  2. On 06/28/2002: Spontaneous assisted vaginal delivery a 6 pound 9 ounce     female infant.  3. Midline episiotomy repair.   DISPOSITION:  At the time of discharge the patient is bottle feeding. She  will follow up in 4-6 weeks time.  She is interested in placement of an IUD  at that time.  She would desire infant circumcision; however, she has made  any financial arrangements for this, thus it is not performed during this  hospital stay, but certainly can be performed within the next 2 weeks as an  outpatient.  The patient is given a copy of the standardized discharge  instructions at the time of discharge. The pediatrician is Dr. John Giovanni.   LABORATORY DATA:  Pertinent laboratory studies:  Admission hemoglobin and  hematocrit 10.5/31.7 with a white count of 8.2, O negative blood type,  antibodies negative.  RhoGAM workup was performed during this  hospitalization.  Postpartum day #1 hemoglobin 10.9, hematocrit 30.0 with a  white count of 14.0.   DISCHARGE MEDICATIONS:  Tylenol #3 and Motrin.   HOSPITAL COURSE:  See previous dictations.  The patient did well with the  epidural, had an uncomplicated delivery, bonded well with the infant and  thus discharged home on postpartum day #1-1/2 in excellent condition and  certain of her desires for placement of an IUD with the full understanding  that this is advantageous to be left in place x10 years  duration.                                               Langley Gauss, M.D.    DC/MEDQ  D:  06/30/2002  T:  06/30/2002  Job:  [id number]

## 2010-09-20 NOTE — Op Note (Signed)
NAME:  Tonya Knapp, Tonya Knapp                       ACCOUNT NO.:  [id number]   MEDICAL RECORD NO.:  [id number]                   PATIENT TYPE:  INP   LOCATION:  A426                                 FACILITY:  [facility]   PHYSICIAN:  Langley Gauss, M.D.                DATE OF BIRTH:  1980-06-11   DATE OF PROCEDURE:  06/27/2002  DATE OF DISCHARGE:                                 OPERATIVE REPORT   EPIDURAL NOTE:   PROCEDURE:  Placement of continuous lumbar epidural analgesia at the L3-L4  interspace performed by Dr. Roylene Reason. Lisette Grinder.   COMPLICATIONS:  None.   DESCRIPTION OF PROCEDURE:  Continuous electronic fetal monitoring was  performed.  There was a reassuring fetal heart rate.  The patient requested  epidural analgesia. The risks and benefits were discussed with the patient.  The patient was placed in the seated position at which time bony landmarks  were identified.  The L3-L4 interspace was chosen.  The patient's back is  sterilely prepped and draped utilizing the epidural kit; 5 mL of 1%  lidocaine plain injected at the midline of the L3-L4 interspace to raise a  small skin wheal.   The 17-gauge Touhy-Schliff needle was then utilized with loss of resistance  in air-filled glass syringe to identify the epidural space.  On the first  attempt the bony spinous process was encountered.  Thus, the epidural needle  is withdrawn and directed slightly cephalic.  On the second attempt there is  excellent loss of resistance, consistent with entry into the epidural space  without difficulty.  Excellent loss of resistance is noted.  No signs of CSF  or intravascular injection obtained.  Thus, initial test dose of 5 mL of  1.5% lidocaine plus plain injected through the epidural needle.  No signs of  CSF or intravascular injection obtained.  Epidural catheter is then inserted  to a depth of 5 cm at which time some bloody fluid was noted.  The epidural  needle is then removed.  Aspiration test  reveals evidence of intravascular  entry of the catheter, thus it is sequentially pulled back by 1 cm  increments and aspirated.  Aspiration continued until no further blood is  obtained.  A second test dose of 2 mL of 1.5% lidocaine plus epinephrine  injected through the epidural catheter.  Again, no signs of CSF or  intravascular injection obtained.   Thus, the catheter is secured into place.  The patient is having warmth in  the buttock consistent ith an epidural entry.  With the epidural catheter  secured into place the patient is returned to the lateral supine position at  which time she is connected to the infusion pump, containing the standard  mixture.  She will be treated with a bolus of 10 mL followed by continuous  infusion rate of 12 mL/hour to obtain adequate epidural catheter analgesia.  The patient continues to have a  reassuring fetal heart rate and thus has  evidence of a bilateral block.                                               Langley Gauss, M.D.    DC/MEDQ  D:  06/28/2002  T:  06/28/2002  Job:  [id number]

## 2010-09-20 NOTE — H&P (Signed)
NAMEYoulanda Knapp             ACCOUNT NO.:  [id number]   MEDICAL RECORD NO.:  [id number]          PATIENT TYPE:  AMB   LOCATION:  DAY                           FACILITY:  [facility]   PHYSICIAN:  Vickki Hearing, M.D.DATE OF BIRTH:  Feb 01, 1981   DATE OF ADMISSION:  DATE OF DISCHARGE:  LH                                HISTORY & PHYSICAL   CHIEF COMPLAINT:  Right carpal tunnel syndrome.   HISTORY:  This is a 30 year old right-hand dominant female with history of  carpal tunnel syndrome since February 2007.  She was scheduled for surgery  in August but then decided not to have surgery.  Had a carpal tunnel  injection which lasted one month and her symptoms returned.  She wishes to  have surgery at this time.  She complains of dropping small objects, trouble  with manual tasks.  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  Initial evaluation was done at the health department.  She was on  hydrocodone and Ultracet with splinting but her symptoms were uncontrolled.  Initial referral was on November 17, 2005.   She had nerve conduction study done by Dr. Gerilyn Pilgrim which showed carpal  tunnel syndrome on the right upper extremity with mild right ulnar  neuropathy.   REVIEW OF SYSTEMS:  No allergies.  She gets nauseous from the Vicodin.  She  does not report any major medical symptoms.  She had a previous  cholecystectomy.  System review is negative.   She does take Zyrtec 10 mg a day.   FAMILY HISTORY:  Cancer and diabetes.   SOCIAL HISTORY:  She is single.  She does smoke 2 packs of cigarettes per  week.  She does not drink alcohol or caffeine.  She has a GED degree.   EXAMINATION:  Reveals normal vital signs, weight 153, pulse 70s, respiratory  rate low 20s.  OVERALL APPEARANCE:  Normal development, grooming, hygiene, body habitus.  Endomorphic deformities none.  HEENT:  Normal.  NECK:  Supple.  No lymphadenopathy.  CHEST:  Clear.  HEART:  Rate and rhythm normal.  ABDOMEN:  Soft.  Peripheral pulses were intact.  Temperature normal.  No edema or tenderness.  SKIN:  Normal including head and neck.  NEUROLOGIC:  Exam showed normal coordination.  She had normal reflexes.  Soft touch evaluation, position and proprioception were normal.  She was  oriented to time, person, place.  Mood and affect were normal.  Positive  findings for carpal tunnel including a positive compression test less than  15 seconds and a positive wrist flexion test.   She is noted to have bilateral carpal tunnel syndrome, the right is worse  than the left.  We will address the right first.   DIAGNOSIS:  Right carpal tunnel syndrome.   PLAN:  Open right carpal tunnel release.      Vickki Hearing, M.D.  Electronically Signed     SEH/MEDQ  D:  03/19/2006  T:  03/19/2006  Job:  [id number]   cc:   Vickki Hearing, M.D.  Fax: [phone]

## 2010-09-20 NOTE — Op Note (Signed)
NAME:  Tonya Knapp, Tonya Knapp                       ACCOUNT NO.:  [id number]   MEDICAL RECORD NO.:  [id number]                   PATIENT TYPE:  INP   LOCATION:  A426                                 FACILITY:  [facility]   PHYSICIAN:  Langley Gauss, M.D.                DATE OF BIRTH:  November 13, 1980   DATE OF PROCEDURE:  06/28/2002  DATE OF DISCHARGE:                                 OPERATIVE REPORT   DELIVERY NOTE:   DIAGNOSIS:  A 38+ week intrauterine pregnancy for induction of labor.   PROCEDURES:  1. Placement of continuous lumbar epidural analgesia.  2. Spontaneous assisted vaginal delivery of a 6 pound 9 ounce female infant.  3. Midline episiotomy repair.   Delivery performed by Dr. Roylene Reason. Lisette Grinder.   COMPLICATIONS:  None.   SPECIMENS:  Cord blood plus venous cord sample to the laboratory.   ESTIMATED BLOOD LOSS:  Total estimated blood loss less than 500 mL.   PLACENTA:  Placenta is examined and noted to be apparently intake with a 3-  vessel umbilical cord.   SUMMARY:  Patient admitted for induction of labor, Gravida 3, para 2, at 38+  weeks gestation, 30 years old.  Patient with 2 prior spontaneous assisted  vaginal deliveries without complications.  Amniotomy is performed with the  findings of clear amniotic fluid.  Fetal scalp electrode documented a  reassuring fetal heart rate throughout the course of labor.  With onset of  comfortable uterine contractions the patient initially requested only IV  Nubain for pain relief.  However, this failed to provide adequate analgesia  thus the patient requested epidural.  The patient, at that point in time,  was 5 cm dilated.  Epidural was placed without complications and functioned  very well throughout the remainder of the labor course.  The patient did  have a Foley catheter placed with findings of clear yellow urine.   Upon reaching complete dilatation the patient did have an excellent urge to  push.  She was placed in the dorsal  lithotomy position and prepped and  draped in the usual sterile manner.  The Foley catheter was removed.  The  patient pushed well during the second stage with descent of the vertex to  the perineal floor; at which time a total of 10 mL of 1% lidocaine plain  injected in the midline of the perineal body.  With distention of the  perineum a small midline episiotomy was performed.  Fetal scalp electrode  was cut.  The patient then pushed well to delivery in a direct OA position  over this midline episiotomy without extension.  The mouth and nares of the  infant were bulb suctioned of clear amniotic fluid.  Renewed expulsive  efforts resulted in a spontaneous rotation to a left anterior shoulder  position.  A nuchal cord x1 without compression is reduced at this time.  Expulsive efforts plus gentle downward tractions resulted in  delivery of  this anterior shoulder beneath the pubic symphysis without difficulty.  Spontaneous and vigorous breathing cry is noted.  Suction, again, is  performed.  The umbilical cord is then milked towards the infant.  The cord  is doubly clamped, and cut; and the infant is handed to the nursing staff  for immediate assessment.  I was unable to obtain an arterial cord sample  thus a venous cord sample was obtained as well as cord blood sample. Gentle  traction on the umbilical cord results in separation and, at that time,  appears to be an intact 3-vessel placenta.  Bimanual massage to the uterus  was performed to increase uterine tone.   Examination of the genital tract reveals a midline episiotomy.  No other  lacerations were noted to have occurred.  The episiotomy was easily repaired  utilizing #0 chromic in a running lock fashion on the vaginal mucosa;  followed by a 2-layer closure of #0 chromic on the perineal body.  The  patient tolerated the delivery very well.  She is taken out of the dorsal  lithotomy position and rolled to her side, at which time, the  epidural  catheter is removed with the blue tip noted to be intact.  Mother and infant  doing very well following delivery.                                               Langley Gauss, M.D.    DC/MEDQ  D:  06/28/2002  T:  06/28/2002  Job:  [id number]

## 2010-09-20 NOTE — H&P (Signed)
NAMEYoulanda Knapp             ACCOUNT NO.:  [id number]   MEDICAL RECORD NO.:  [id number]          PATIENT TYPE:  AMB   LOCATION:  DAY                           FACILITY:  [facility]   PHYSICIAN:  Vickki Hearing, M.D.DATE OF BIRTH:  1980-12-23   DATE OF ADMISSION:  DATE OF DISCHARGE:  LH                                HISTORY & PHYSICAL   CHIEF COMPLAINT:  Carpal tunnel syndrome, right upper extremity   HISTORY OF PRESENT ILLNESS:  Ms. Tonya Knapp is a 30 year old right-hand  dominant female with history of carpal symptoms for the last 6 months.  Her  symptoms included pain and paresthesias with radiation up into the left  upper extremity and shoulder with weakness and night pain.  She also  complained of dropping small objects and trouble with manual tasks.  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  She was initially seen [facility] the [facility], took  hydrocodone, Ultracet, and a splint was recommended, which she wore, and  that did not control her symptoms.  She was referred to me on November 17, 2005.   She had a nerve conduction study done by Dr. Gerilyn Pilgrim that showed carpal  tunnel syndrome of the right upper extremity with a mild right ulnar  neuropathy at the elbow.   REVIEW OF SYSTEMS:  She does not have any known allergies.  She gets  nauseous from Vicodin.  She reports no major medical problems.  She did  report a previous cholecystectomy.   She takes Zyrtec 10 mg a day.   FAMILY HISTORY:  Cancer and diabetes.   SOCIAL HISTORY:  Single.  She is now employed as a Engineer, petroleum.  She  smokes two packs cigarettes per week.  Does not use alcohol or caffeine.  She has her GED degree.   PHYSICAL EXAMINATION:  VITAL SIGNS:  Her examination reveals normal vital  signs, weight 153, pulse 72, respiratory rate 18.  OVERALL APPEARANCE:  Normal development, nutrition, grooming, hygiene.  Body  habitus is  endomorphic.  Deformities:  None.  HEENT:  Normal.  NECK:  Supple.  No lymphadenopathy.  CHEST:  Clear.  CARDIAC:  Heart rate and rhythm were normal.  EXTREMITIES:  Peripheral pulses were.  Temperature normal.  No edema or  tenderness.  SKIN:  Normal both extremities, head and neck.  NEUROLOGIC:  Coordination was normal.  Gross sensation to soft touch,  position and proprioception were normal.  Pinprick was not tested.  Soft  touch reflexes were normal.  She was oriented to time, person and place.  Mood and affect were normal.   She had positive compression test over the right and left carpal tunnel at  less than 30 seconds on the right, and the left side did not react for up to  a minute.  Range of motion was normal.  She had a right upper extremity  weakness to grip.  Wrists were stable without deformity.   Nerve conduction study and clinical signs and history do indicate carpal  tunnel syndrome.  The  nerve conduction study was done December 03, 2005.   The patient has a diagnosis of right carpal tunnel syndrome.  Plan is for  open right carpal tunnel release.  Risks and benefits have been explained to  the patient.  The patient accepts the risks of the surgery.  Alternative  treatment of injection and continued bracing was offered but declined.      Vickki Hearing, M.D.  Electronically Signed     SEH/MEDQ  D:  12/30/2005  T:  12/30/2005  Job:  [id number]

## 2010-09-20 NOTE — Op Note (Signed)
NAMEGildardo Griffes NO.:  [id number]   MEDICAL RECORD NO.:  [id number]          PATIENT TYPE:  AMB   LOCATION:  DAY                           FACILITY:  [facility]   PHYSICIAN:  Vickki Hearing, M.D.DATE OF BIRTH:  1980-08-27   DATE OF PROCEDURE:  04/03/2006  DATE OF DISCHARGE:  04/03/2006                               OPERATIVE REPORT   HISTORY:  A 30 year old female with right carpal tunnel syndrome.  She  failed and injection therapy, bracing, anti-inflammatories, activity  modification.  She presented for surgery.   PREOP DIAGNOSIS:  Carpal tunnel syndrome on the right.   POSTOP DIAGNOSIS:  Carpal tunnel syndrome on the right.   PROCEDURE:  Right carpal tunnel release.   SURGEON:  Vickki Hearing, M.D.   ANESTHETIC:  Bier block   FINDINGS:  Compressed median nerve, normal carpal tunnel contents  otherwise.   DESCRIPTION OF PROCEDURE:  The patient was identified as Tonya Knapp, she marked her right wrist and hand as the surgical site; I  countersigned it.  We updated her history and physical.  She was given  her preoperative antibiotics.  She was taken to the operating room for  Bier block.  After successful Bier block, her right upper extremity was  prepped and draped in a sterile technique.  We completed the time-out  procedure, confirmed patient identity, procedure as right carpal tunnel  release, and antibiotic administration.   Straight incision was made over the carpal tunnel.  Subcutaneous tissue  was divided with sharp dissection.  Blunt dissection was carried out  distally to the distal aspect of the transverse carpal ligament.  Blunt  dissection was carried out beneath the ligament; and the ligament was  released.  Carpal tunnel contents were inspected; findings as are  listed.  Wound was irrigated and closed with 3-0 nylon suture.  We  injected 10 mL of 1/2% plain Marcaine and applied a sterile dressing.  Tourniquet was released.   Fingers were viable.  The patient was taken to  recovery room in stable condition.  Postop plan follow-up in a few days  for a dressing change.  She was discharged on Lorcet Plus 1 p.o. q.4 h.  #42 with two refills.      Vickki Hearing, M.D.  Electronically Signed    SEH/MEDQ  D:  04/03/2006  T:  04/03/2006  Job:  [id number]

## 2011-02-17 LAB — CBC
Hemoglobin: 12.5
MCHC: 34.3
MCHC: 34.3
MCV: 83.5
RBC: 3.83 — ABNORMAL LOW
RBC: 4.37
WBC: 11.4 — ABNORMAL HIGH

## 2011-02-17 LAB — RH IMMUNE GLOB WKUP(>/=20WKS)(NOT WOMEN'S HOSP)

## 2011-02-20 LAB — URINALYSIS, ROUTINE W REFLEX MICROSCOPIC
Glucose, UA: NEGATIVE
Protein, ur: NEGATIVE
pH: 7

## 2011-02-20 LAB — RAPID URINE DRUG SCREEN, HOSP PERFORMED
Barbiturates: NOT DETECTED
Benzodiazepines: NOT DETECTED

## 2016-02-26 LAB — CBC WITH DIFF, BLOOD
Hct: 39
Hgb: 13.2
MCH: 29.8
MCHC: 33.8
MCV: 88
MPV: 11.1
Plt Count: 267
RBC: 4.43
RDW: 13.1
WBC: 9

## 2016-02-26 LAB — QUANTIFERON-TB, BLOOD: Quantiferon: NEGATIVE

## 2016-02-26 LAB — ANTIBODY SCREEN: Antibody Screen: NEGATIVE

## 2016-02-26 LAB — RUBELLA IGG ANTIBODY, BLOOD
Rubella IGG Ab (EIA): NON-IMMUNE/NOT IMMUNE
Rubella IgG Ab (EIA): NON-IMMUNE/NOT IMMUNE

## 2016-02-26 LAB — HEPATITIS B SURFACE AG, BLOOD: HBsAg: NEGATIVE

## 2016-02-26 LAB — VARICELLA IGG ANTIBODY (ELISA), BLOOD
Varicella Zoster IgG Ab: IMMUNE
Varicella Zoster IgG Antibody: IMMUNE

## 2016-02-26 LAB — CYSTIC FIBROSIS, 39 MUTATIONS, BLOOD: CF Report: NEGATIVE

## 2016-02-26 LAB — RAPID PLASMA REAGIN QUANTITATIVE, BLOOD: RPR: NEGATIVE

## 2016-03-06 LAB — GLUCOSE, 1HR POST DOSE BLOOD
GLU, 1Hr Post Dose: 229
Glu, 1Hr Post Dose: 229

## 2016-03-07 LAB — CHLAMYDIA/GONORRHEA PCR, GENITAL
Chlamydia PCR, Genital: NEGATIVE
Chlamydia PCR, Genital: NEGATIVE
Gonococcus PCR, Genital: NEGATIVE

## 2016-03-07 LAB — CYTOPATH GYN: Cytology history:: NORMAL

## 2016-03-10 ENCOUNTER — Telehealth (HOSPITAL_BASED_OUTPATIENT_CLINIC_OR_DEPARTMENT_OTHER): Payer: Self-pay

## 2016-03-10 NOTE — Telephone Encounter (Signed)
Patient called to schedule. Stated she is bing referred [facility]. Would like to schedule as soon as possible. Informed her referral was faxed to the [facility] location. Advised her that it need to be reviewed and will be called back to schedule. Patient can be reached at [phone]. Please advise, thank you.

## 2016-03-11 NOTE — Telephone Encounter (Signed)
New GDM/Type 2 DM [facility] elevated 1 hr GTT (229) at 15 wks. Pt sched for GDM class on 11/17. Email sent to pt with appt info, nutrition guidelines, glucose log and food diary to [email or web address].  NOB scheduled 11/20 at 2pm.

## 2016-03-13 ENCOUNTER — Encounter (INDEPENDENT_AMBULATORY_CARE_PROVIDER_SITE_OTHER): Payer: Self-pay | Admitting: Gynecologic Oncology

## 2016-03-13 LAB — ABO/RH: Rh: NEGATIVE

## 2016-03-21 ENCOUNTER — Ambulatory Visit: Payer: Medicaid Other

## 2016-03-21 DIAGNOSIS — O0990 Supervision of high risk pregnancy, unspecified, unspecified trimester: Secondary | ICD-10-CM

## 2016-03-21 DIAGNOSIS — O09522 Supervision of elderly multigravida, second trimester: Secondary | ICD-10-CM

## 2016-03-21 DIAGNOSIS — O0992 Supervision of high risk pregnancy, unspecified, second trimester: Secondary | ICD-10-CM

## 2016-03-21 DIAGNOSIS — R7303 Prediabetes: Secondary | ICD-10-CM

## 2016-03-21 DIAGNOSIS — O24419 Gestational diabetes mellitus in pregnancy, unspecified control: Principal | ICD-10-CM

## 2016-03-21 MED ORDER — GLUCOSE BLOOD VI STRP
1.0000 | ORAL_STRIP | Freq: Every day | 9 refills | Status: DC
Start: 2016-03-21 — End: 2016-08-20

## 2016-03-21 MED ORDER — LANCETS MISC
1.0000 | Freq: Every day | 9 refills | Status: DC
Start: 2016-03-21 — End: 2016-08-20

## 2016-03-21 NOTE — Progress Notes (Signed)
Memory Danceloisa is a 35 year old female G5P4 at gestational age 48106w2d Estimated Date of Delivery: 08/27/16    Patient presents today for diabetes education [facility]DM [facility] at [redacted] weeks GA via 1-hr GTT 229 mg/dl.     Assessment / Plan:    1. Diabetes: Prescott Gumloisa J Bellotti was given overview of diabetes in pregnancy, causes, risks to mom and fetus, lifetime risks, treatment and goals. Given Sweet Success packet, 1800-2000 calorie GDM diet, exercise guidelines, food diary and blood sugar logbook.  Marytza is using Bayer Contour blood glucose meter since dx, testing 4x daily.  Patient given blood sugar goals and instructed to test blood sugars at fasting and 1 hour post breakfast, lunch and dinner and record in logbook.  Pt was instructed on when to report abnormal values and instructed to fax/email/call blood sugars weekly for review and bring logbook and glucose meter to every appointment RX for test strips and lancets given per patients' pharmacy.     2. Routine OB/Fetus: Follow-up 11/20 with Dr. Allyne GeeSanders.    3. Education: Education: diabetes overview, pregnancy overview, maternal risks and fetal risks  Nutrition: pregnancy nutrition, meal distribution /  timing, meal composition, labels, simple sugars and sweeteners/caffeine  Exercise:  type and frequency and timing and duration  Blood glucose:  blood glucose goals, meter instruction/return demo, testing frequency and how/when to report values  Psychosocial:  reaction to pregnancy and Edinburgh score 1  Fetal Surveillance: ultrasound, fetal movement count instruction and NST/CST  Medication: indication for initiation, type / action and administration  Hypoglycemia: signs / symptoms and treatment / prevention  Hyperglycemia: signs / symptoms and treatment / prevention  Materials given:  sweet success packet, meal plan and record book    Plan of care discussed with patient and patient verbalized understanding: yes   All questions were answered at the end of the  class.    Patient education session lasted 3 hrs    Enis SlipperSarah Jane Verl Kitson, BSN, RN, CDE

## 2016-03-23 DIAGNOSIS — O26899 Other specified pregnancy related conditions, unspecified trimester: Secondary | ICD-10-CM

## 2016-03-23 DIAGNOSIS — N390 Urinary tract infection, site not specified: Secondary | ICD-10-CM | POA: Insufficient documentation

## 2016-03-23 DIAGNOSIS — Z6791 Unspecified blood type, Rh negative: Secondary | ICD-10-CM | POA: Insufficient documentation

## 2016-03-23 DIAGNOSIS — Z23 Encounter for immunization: Secondary | ICD-10-CM | POA: Insufficient documentation

## 2016-03-24 ENCOUNTER — Other Ambulatory Visit (HOSPITAL_BASED_OUTPATIENT_CLINIC_OR_DEPARTMENT_OTHER): Payer: Medicaid Other

## 2016-03-24 ENCOUNTER — Encounter (HOSPITAL_BASED_OUTPATIENT_CLINIC_OR_DEPARTMENT_OTHER): Payer: Self-pay | Admitting: Gynecologic Oncology

## 2016-03-24 ENCOUNTER — Ambulatory Visit: Payer: Medicaid Other | Attending: Gynecology | Admitting: Gynecologic Oncology

## 2016-03-24 VITALS — BP 113/76 | HR 77 | Temp 98.6°F | Ht <= 58 in | Wt 170.6 lb

## 2016-03-24 DIAGNOSIS — Z20828 Contact with and (suspected) exposure to other viral communicable diseases: Secondary | ICD-10-CM | POA: Insufficient documentation

## 2016-03-24 DIAGNOSIS — Z6791 Unspecified blood type, Rh negative: Secondary | ICD-10-CM

## 2016-03-24 DIAGNOSIS — Z283 Underimmunization status: Secondary | ICD-10-CM | POA: Insufficient documentation

## 2016-03-24 DIAGNOSIS — O09892 Supervision of other high risk pregnancies, second trimester: Secondary | ICD-10-CM

## 2016-03-24 DIAGNOSIS — O24414 Gestational diabetes mellitus in pregnancy, insulin controlled: Secondary | ICD-10-CM | POA: Insufficient documentation

## 2016-03-24 DIAGNOSIS — Z23 Encounter for immunization: Secondary | ICD-10-CM | POA: Insufficient documentation

## 2016-03-24 DIAGNOSIS — O0990 Supervision of high risk pregnancy, unspecified, unspecified trimester: Principal | ICD-10-CM

## 2016-03-24 DIAGNOSIS — O26899 Other specified pregnancy related conditions, unspecified trimester: Secondary | ICD-10-CM

## 2016-03-24 DIAGNOSIS — O9989 Other specified diseases and conditions complicating pregnancy, childbirth and the puerperium: Secondary | ICD-10-CM | POA: Insufficient documentation

## 2016-03-24 DIAGNOSIS — Z2839 Other underimmunization status: Secondary | ICD-10-CM

## 2016-03-24 DIAGNOSIS — N3000 Acute cystitis without hematuria: Secondary | ICD-10-CM | POA: Insufficient documentation

## 2016-03-24 DIAGNOSIS — O24410 Gestational diabetes mellitus in pregnancy, diet controlled: Secondary | ICD-10-CM | POA: Insufficient documentation

## 2016-03-24 DIAGNOSIS — O09522 Supervision of elderly multigravida, second trimester: Secondary | ICD-10-CM

## 2016-03-24 DIAGNOSIS — O09529 Supervision of elderly multigravida, unspecified trimester: Secondary | ICD-10-CM | POA: Insufficient documentation

## 2016-03-24 DIAGNOSIS — O09512 Supervision of elderly primigravida, second trimester: Secondary | ICD-10-CM

## 2016-03-24 DIAGNOSIS — Z20821 Contact with and (suspected) exposure to Zika virus: Secondary | ICD-10-CM

## 2016-03-24 DIAGNOSIS — O09899 Supervision of other high risk pregnancies, unspecified trimester: Secondary | ICD-10-CM | POA: Insufficient documentation

## 2016-03-24 LAB — UA, CHEM ONLY POCT
Bilirubin: NEGATIVE
Blood: NEGATIVE
Glucose: NEGATIVE
Leuk Esterase: NEGATIVE
Nitrite: NEGATIVE
Protein: NEGATIVE
Specific Gravity: 1.02 (ref 1.002–1.030)
Urobilinogen: 0.2 (ref 0.2–1)
pH: 6 (ref 5.0–8.0)

## 2016-03-24 LAB — CREATININE W/GFR: Creatinine: 0.31 mg/dL — ABNORMAL LOW (ref 0.51–0.95)

## 2016-03-24 LAB — ALT (SGPT), BLOOD: ALT (SGPT): 12 U/L (ref 0–33)

## 2016-03-24 LAB — HEMOGRAM, BLOOD
Hct: 37 % (ref 34.0–45.0)
Hgb: 12.6 gm/dL (ref 11.2–15.7)
MCH: 29.9 pg (ref 26.0–32.0)
MCHC: 34.1 g/dL (ref 32.0–36.0)
MCV: 87.9 um3 (ref 79.0–95.0)
MPV: 11.4 fL (ref 9.4–12.4)
Plt Count: 284 10*3/uL (ref 140–370)
RBC: 4.21 10*6/uL (ref 3.90–5.20)
RDW: 13.7 % (ref 12.0–14.0)
WBC: 10 10*3/uL (ref 4.0–10.0)

## 2016-03-24 LAB — URINALYSIS WITH CULTURE REFLEX, WHEN INDICATED
Bilirubin: NEGATIVE
Blood: NEGATIVE
Glucose: NEGATIVE
Leuk Esterase: NEGATIVE
Nitrite: NEGATIVE
Protein: NEGATIVE
Specific Gravity: 1.012 (ref 1.002–1.030)
Urobilinogen: NEGATIVE
pH: 6 (ref 5.0–8.0)

## 2016-03-24 LAB — HIV 1/2 ANTIBODY & P24 ANTIGEN ASSAY, BLOOD: HIV 1/2 Antibody & P24 Antigen Assay: NONREACTIVE

## 2016-03-24 LAB — URIC ACID, BLOOD: Uric Acid: 2.9 mg/dL (ref 2.4–5.7)

## 2016-03-24 LAB — RANDOM URINE TP/CR PANEL
Creatinine, Urine: 51 mg/dL (ref 29–226)
TP/CR Ratio Random: 0.14
Total Protein, Urine: 7 mg/dL

## 2016-03-24 LAB — BUN (BLOOD UREA NITROGEN): BUN: 4 mg/dL — ABNORMAL LOW (ref 6–20)

## 2016-03-24 LAB — ABO/RH: ABO/RH: O NEG

## 2016-03-24 LAB — AST (SGOT), BLOOD: AST (SGOT): 17 U/L (ref 0–32)

## 2016-03-24 LAB — GFR: GFR: >60 mL/min

## 2016-03-24 LAB — GLYCOSYLATED HGB(A1C), BLOOD: Glyco Hgb (A1C): 6.1 % — ABNORMAL HIGH (ref 4.8–5.8)

## 2016-03-24 MED ORDER — INSULIN SYRINGE-NEEDLE U-100 30G X 5/16" 0.5 ML MISC
1.0000 | Freq: Three times a day (TID) | 9 refills | Status: DC
Start: 2016-03-24 — End: 2016-07-23

## 2016-03-24 MED ORDER — INSULIN ASPART 100 UNIT/ML SC SOLN
SUBCUTANEOUS | 9 refills | Status: DC
Start: 2016-03-24 — End: 2016-05-21

## 2016-03-24 MED ORDER — PRENATAL VITAMIN 27-0.8 MG PO TABS
1.00 | ORAL_TABLET | Freq: Every day | ORAL | Status: DC
Start: ? — End: 2016-08-20

## 2016-03-24 MED ORDER — INSULIN ISOPHANE HUMAN 100 UNIT/ML SC SUSP
SUBCUTANEOUS | 9 refills | Status: DC
Start: 2016-03-24 — End: 2016-05-21

## 2016-03-24 NOTE — Assessment & Plan Note (Signed)
E coli UTI 02/16/16 s/p tx with Keflex  TOC ordered today

## 2016-03-24 NOTE — Patient Instructions (Signed)
Return to clinic in 3 weeks

## 2016-03-24 NOTE — Assessment & Plan Note (Signed)
Baseline PET labs today  A1c to decipher DM2 versus GDM  See CDE not for full details

## 2016-03-24 NOTE — Progress Notes (Signed)
NEW OB VISIT    HPI: [name] is a 35 year old G5P4004 @[redacted]w[redacted]d  by LMP c/w 9w sono (reported in [facility], pt to bring records), pregnancy c/b GDMA2 versus DM2, rubella non-immune, urinary tract infection (E coli) presenting for new OB visit.     SUBJECTIVE:   Pt is feeling well today - reports no recent illness, fevers/chills, nausea/vomiting, regular/painful contractions, leakage of fluid, vaginal bleeding, or dysuria.    Occasionally having headaches. Has not yet tried Tylenol.     Dating:  Patient's last menstrual period was 11/21/2015 (exact date).  Estimated Date of Delivery: 08/27/16    Ultrasound Exams:   Study Date EGA Comments   1st Trimester Scan Not avilable Not available Not available         Most Recent Scan 03/24/16 2571w6d Anterior placenta.   BDP: 17+4  HC: 17+3  AC: 18+1  FL: 18+1  EFW 220g, 17+6     Obstetric History    G5   P4   T4   P0   A0   L4     SAB0   TAB0   Ectopic0   Multiple0   Live Births4       # Outcome Date GA Lbr Len/2nd Weight Sex Delivery Anes PTL Lv   5 Current            4 Term      Vag-Spont   LIV   3 Term      Vag-Spont   LIV   2 Term      Vag-Spont   LIV   1 Term      Vag-Spont   LIV      Obstetric Comments   Reportedly normal Pap smear in 2017. No record available.    No history of abnormal paps.    No GC/CT.    Irregular menses. Firm LMP.    GBS with prior pregnancies: no     Past Medical History:   Diagnosis Date   . Gestational diabetes      -- H/o blood transfusions? no, she is willing to accept if needed.    Past Surgical History:   Procedure Laterality Date   . CARPAL TUNNEL RELEASE     . CHOLECYSTECTOMY       No family history on file.    Social History     Social History   . Marital status: Married     Spouse name: N/A   . Number of children: N/A   . Years of education: N/A     Occupational History   . Not on file.     Social History Main Topics   . Smoking status: Former Games developermoker   . Smokeless tobacco: Never Used   . Alcohol use Not on file   . Drug use: Not on file    . Sexual activity: Not on file     Other Topics Concern   . Not on file     Social History Narrative     EDS Score (Calculated): 1 (03/21/2016  2:00 PM)    Current Outpatient Prescriptions on File Prior to Visit   Medication Sig Dispense Refill   . glucose blood test strip 1 strip by Other route 5 times daily. 150 strip 9   . lancets 1 Lancet by Other route 5 times daily. 100 Lancet 9     No current facility-administered medications on file prior to visit.        Review of  patient's allergies indicates no known allergies.    System Review (Pertinent Positives): As Per HPI  Physical Exam    BP 113/76 (BP Location: Right arm, BP Patient Position: Sitting, BP cuff size: Regular)  Pulse 77  Temp 98.6 F (37 C) (Oral)  Ht 4\' 7"  (1.397 m)  Wt 77.4 kg (170 lb 10.2 oz)  LMP 11/21/2015 (Exact Date)  BMI 39.66 kg/m2    GEN: NAD. Pleasant. Appropriate affect.   CARDIO: RRR. No murmurs, rubs, or gallops.  RESP: Clear to auscultation bilaterally. No wheezes, ronchi, or rales.   ABD: Gravid, Soft, non-distended, non-tender. Bowel sounds present.  - No guarding, no rebound tenderness. No masses or organomegaly appreciated.   EXT: No edema or tenderness  PELVIC: Deferred    Fundal Height: 19cm  FHT: 156    Pregnancy Issues:  Insulin controlled gestational diabetes mellitus (GDM) in second trimester  Baseline PET labs today  A1c to decipher DM2 versus GDM  See CDE not for full details    Urinary tract infection  E coli UTI 02/16/16 s/p tx with Keflex  TOC ordered today    High-risk pregnancy supervision  Registration labs notable for rubella non-immune.   Missing PNL ordered.   Anatomy scan ordered.   Patient encouraged to bring 9w scan from [facility].     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   D/w pt at next visit re: Zika testing as this was not adequately addressed 03/24/16.    AMA (advanced maternal age) multigravida 35+  Scheduled with Genetics 04/2016    Rh negative state in antepartum  period  Rhogam at 28w    Pt seen, evaluated, and discussed with Dr. Ethelene Halamos, attending physician.     De HollingsheadBrooke Elizabeth Thaddeaus Monica, MD, [phone]  Reproductive Medicine, PGY-2  Pager [phone]

## 2016-03-24 NOTE — Progress Notes (Signed)
MFM Fellow DIABOB visit    Theresa Pollard is a 35 year old G5P4004 at [redacted]w[redacted]d here for routine prenatal care.     Rubella nonimmune  For MMR postpartum    High risk pregnancy  For flu vaccine today.  Dated by 9 week sono in [facility] - to bring results  Quad screen and anatomy sono ordered.  HIV ordered with T&S to complete registration labs.  SMA offered and ordered.    GDMA2 vs T2DM  Please see CDE note for glucose values and regimen changes.  To start insulin today.  A1c ordered.  Today counseled about repercussions of DM in pregnancy.  Risk of macrosomia, traumatic delivery, increased risk of cesarean and neonatal metabolic complications such neonatal hypoglycemia, NICU stay, stillbirth were reviewed.  Patients area also at increased risk of pre-eclampsia so baseline pre-eclampsia labs recommended.   Management including goals:  Fasting <90 mg/dL and postprandial <130mg/dL also reviewed.  Management entails reviewing glucose control every 1 week. She understands that we recommend serial ultrasounds for growth and twice weekly testing for fetal well-being in the third trimester, sometimes earlier if indicated.   Additionally for type 1 DM recommend glucagon kit for treatment of hypoglycemia.  Hypoglycemia precautions extensively reviewed.    UTI  For TOC today, treated with keflex    Rh negative  For rhogam at 28 weeks    [place] [id number]  Maternal-Fetal Medicine Fellow  Pager [phone]      Patient discussed with Dr. Ramos who agrees with the plan.

## 2016-03-24 NOTE — Assessment & Plan Note (Signed)
Registration labs notable for rubella non-immune.   Missing PNL ordered.   Anatomy scan ordered.   Patient encouraged to bring 9w scan from [facility].

## 2016-03-24 NOTE — Progress Notes (Signed)
Theresa Pollard is a 35 year old female G1P0 at gestational age 5645w5d Estimated Date of Delivery: 08/27/16    [facility]  Diabetes type:GDMa2 dx at 15 weeks via 1-hr GTT 229 mg/dl.  BP 113/76 (BP Location: Right arm, BP Patient Position: Sitting, BP cuff size: Regular)  Pulse 77  Temp 98.6 F (37 C) (Oral)  Ht 4\' 7"  (1.397 m)  Wt 77.4 kg (170 lb 10.2 oz)  LMP 11/21/2015 (Exact Date)  BMI 39.66 kg/m2    Chief Complaint   Patient presents with   . Initial Prenatal Visit       Times of Day Glucose Tested & Results    Nov 17 18 19 20  Current Med Med Changes   Fasting 121 148 147  None NPH 12 units, Novolog 12 units with, breakfast   After Bkfast  165 155      After Lunch  119   None None   After Dinner 155 153   None Humalog 8 units with, dinner   Bedtime     None NPH 14 units, At 10 PM or later   Early AM   (0200-0300)         Initial insulin dose calculated based on 0.7 u/kg per 2013 DAPP recommendations    Assessment / Plan:    1. Diabetes: First week of glucose testing. Elevated FBGs throughout.  Diet with some high CHO choices, but pt is working on making changes.  Pt agrees with the plan to start insulin for DM  Management. Full insulin instruction given with written materials.  Reviewed method of action, dose, administration, care and storage of insulin Humalog and NPH, and s/s/tx of hypoglycemia.  Pt demonstrated appropriate technique for medication preparation, dose and injection administration. Verbalizes appropriate medication timing and dose.  All questions asked and answered.    2. Routine OB/Fetus: ROB 3 wks, send in BGs 1 week.  Needs anatomy scan ASAP.  2nd tri blood work today.      3. Education discussed: Blood glucose / urine monitoring:  blood glucose goals, testing frequency and how/when to report values  Fetal Surveillance: prenatal genetic screening and ultrasound  Medication: indication for initiation, type / action, dose / administration, injection site / rotation, record  keeping and adjustments  Hypoglycemia: signs / symptoms and treatment / prevention  Hyperglycemia: signs / symptoms and treatment / prevention  Materials given:  insulin start packet    Plan of care discussed with patient and patient verbalized understanding: Yes   Educational time spent with patient: 30 min  .

## 2016-03-24 NOTE — Progress Notes (Signed)
Attending  Patient seen.  Chart, assessment and plan reviewed with Resident.  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    1. 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 Agree with above plan and counseling.  Discussed management of diabetes in pregnancy.  Risk including malformations, macrosomia, traumatic delivery, neonatal metabolic complications reviewed.    Plan baseline labs including A1C and pre-eclampsia labs.     2.   AMA- scheduled to see genetics, quad screen today.  Anatomy sono scheduled  3.  Rh negative  4.  Rubella nonimmune- needs vaccine pp  5.  Obesity- limited weight gain this pregnancy reviewed  6.  Flu vaccine today.  Theresa DykeGladys Alexandra Kiriana Worthington, MD

## 2016-03-24 NOTE — Assessment & Plan Note (Signed)
Scheduled with Genetics 04/2016

## 2016-03-24 NOTE — Assessment & Plan Note (Signed)
Pt reported traveling to [place] to [place].   D/w pt at next visit re: Zika testing as this was not adequately addressed 03/24/16.

## 2016-03-24 NOTE — Assessment & Plan Note (Signed)
[ ]   Rhogam at 28w

## 2016-03-26 NOTE — Interdisciplinary (Signed)
Nutrition Note  Nutrition f/u:  35 year old G5P4004 @[redacted]w[redacted]d , diagnosed with GDM  Attended class on 11/17    Ht:  4'7"            Current Weight:171#                     Pregravid Weight: 165#  Pregravid BMI:  38.3    Labs:  A1C 6.1(11/20)  Medications/Supplements:   Current Outpatient Prescriptions   Medication Sig   . glucose blood test strip 1 strip by Other route 5 times daily.   . insulin aspart (NOVOLOG) 100 UNIT/ML injection Inject 12 units with breakfast and 8 units with dinner   . insulin NPH (HUMULIN N) 100 UNIT/ML injection Inject 12 units before breakfast, 14 units after 10 PM   . Insulin Syringe-Needle U-100 (INS SYR UL THIN SHORT .5CC/30G) 30G X 5/16" 0.5 ML MISC Inject 1 Syringe under the skin 3 times daily.   Marland Kitchen. lancets 1 Lancet by Other route 5 times daily.   . prenatal vitamin (PRENATAL VITAMIN W/ IRON W/ FOLIC ACID) tablet Take 1 tablet by mouth daily.     No current facility-administered medications for this visit.        Current Eating Pattern: patient states she is trying to eat according to plan. She is still figuring it all out. She misses her snack at work, and eats more at the next meal  Breakfast-  2 eggs, 1-2 slices toast, tater tots or potatoes  Snack- none  Lunch- pasta with pesto, vegetables, cheese   Snack- none  Dinner - chicken with red sauce, 2 tortillas  Snack- sometimes has an    Beverages-   Only water    Exercise Patterns: she is on her feet, walking all day  GI Symptoms: none    Estimated Nutrient Needs:  1800-2100 kcal,  ~70 g protein, ~1800-2100 ml  fluids    Nutrition Diagnosis:    Pt with food and nutrient related knowledge deficit related to diagnosis of GDM as evidenced by diet hx and information provided during interview.    Intervention:   Handouts Given:  Sweet Success Packets, snacks idea, and guidelines.  Goals:  1. Eat protein containing meals and snacks at regular intervals throughout the day  2. Follow Sweet Success meal plan and guidelines as outlined   3.    Limit starch to 1 portion at breakfast and 2 portions at lunch and at dinner   4.   Have a protein with every meal and snack    Education: Reviewed principle of Sweet Success meal plans, timing, and composition as well a general physiology with pregnancy and GDM.Educated pt on how to read a nutrition facts label to determine carb servings. Discussed recommended pregnancy weight gain guidelines. Thoroughly reviewed macronutrient metabolism with an emphasis on carbohydrates.    Monitoring and Evaluation:  PRN  Time Spent with Pt: 20 minutes    Maudry DiegoGloria Wilder, RD pager [phone]

## 2016-03-31 ENCOUNTER — Encounter (HOSPITAL_BASED_OUTPATIENT_CLINIC_OR_DEPARTMENT_OTHER): Payer: Self-pay | Admitting: Gynecologic Oncology

## 2016-03-31 ENCOUNTER — Encounter (INDEPENDENT_AMBULATORY_CARE_PROVIDER_SITE_OTHER): Payer: Self-pay

## 2016-03-31 DIAGNOSIS — O09522 Supervision of elderly multigravida, second trimester: Secondary | ICD-10-CM

## 2016-03-31 DIAGNOSIS — Z006 Encounter for examination for normal comparison and control in clinical research program: Secondary | ICD-10-CM

## 2016-03-31 NOTE — Interdisciplinary (Signed)
Social Work Note:  This SW unable to Lennar Corporationmeet/assess patient at initial DIABOB appt 11/20 due to being unavailable. Chart reviewed, including record from referring provider. She's a 35 year old, married, [id number], with an unplanned but desired pregnancy, and with GDM diagnosis and started on insulin.  Chart review did not identify any high-risk psychosocial issues.   Patient also filled out the psychosocial questionnaire for DAPP and no areas of concern identified either.  SW will remain available, and contact upon staff or patient request.  Theresa LeylandLeonora Calsada, LCSW, MPH

## 2016-04-09 LAB — SPINAL MUSCULAR ATROPHY (SMA) COPY NUMBER ANALYSIS: SMA Copy Number, SMN1 Copies: 3

## 2016-04-10 ENCOUNTER — Telehealth (HOSPITAL_BASED_OUTPATIENT_CLINIC_OR_DEPARTMENT_OTHER): Payer: Self-pay

## 2016-04-10 NOTE — Telephone Encounter (Signed)
Diabob pt with GDMa2 @ 6438w1d on insulin last seen a couple of weeks ago. Pt was to have sent in BG for review has yet to do so. Called pt to send she states she will later today. Pt did start insulin as recommneded last visit she denies any hypoglycemia events. Pt has pending Diabob appt 04/14/16.

## 2016-04-13 ENCOUNTER — Encounter (HOSPITAL_BASED_OUTPATIENT_CLINIC_OR_DEPARTMENT_OTHER): Payer: Self-pay | Admitting: Family Medicine

## 2016-04-13 NOTE — Progress Notes (Signed)
Theresa Pollard is a 35 year old female with G5P4004 at [redacted]w[redacted]d([place] 9 [place]/GD[place] (vs DM2), AMA, UTI, Rh neg, travel to Zika-infected area, obesity, and rubella non-immune, who presents for routine prenatal care.    No report of cramping/ctx, no leakage of fluid, no vaginal bleeding, no HA/blurred vision.     [place] Den[place] symptoms of acute Zika infection in past 12 weeks (no rash, fever, conjunctivitis, arthralgias/myalgias).     04/14/16  1506   BP: 104/60   Pulse: 86   Resp: 18   Temp: 98.2 F (36.8 C)   SpO2: 98%     FHT 140    [place] (see report under Media): 956w1dEDD 08/27/16    A/P:    DM:  1 hr GTT = 229 (03/06/16). A1C 6.1 (03/24/16). BS log reviewed - fair control. Med changes per CDE note. Taking ASA. Optho consultpending. Baseline preeclamptic labs WNL  FWB: anatomic survey [place] last [facility]   Rh negative: plan Rhogam after 28 wks  Hx UTI: >100K E Coli, treated w/Keflex. Needs TOC - ordered  Zika virus exposure: oral and written information provided on importance of condoms and prevention of mosquito bites. Per CDC protocol, needs testing x 3 in pregnancy. Urine and blood Zika NAAT testing ordered.  Rubella non-Immune: plan MMR in postpartum period  MOD: prior SVD x 4. Largest baby 7#. Delivery planning after 36 wk scan.  MOC: leaning towards permanent sterilization, consent signed today. Pt aware she can change her mind 'but I don't think I will.' Written information provided.  Routine PN Labs: results of prior Pap needed [facility]  Immunz: s/p flu vaccine. Plan Tdap after 27 wks  Psychosocial: SW assessment of self-questionairre: no concerns.  Breast pump: plan to order in 3rd trimester    ElTruddie HiddenMD  Signature Derived From Controlled Access Password, April 14, 2016, 4:14 PM

## 2016-04-14 ENCOUNTER — Encounter (HOSPITAL_BASED_OUTPATIENT_CLINIC_OR_DEPARTMENT_OTHER): Payer: Self-pay | Admitting: Family Medicine

## 2016-04-14 ENCOUNTER — Other Ambulatory Visit (HOSPITAL_BASED_OUTPATIENT_CLINIC_OR_DEPARTMENT_OTHER): Payer: Medicaid Other

## 2016-04-14 ENCOUNTER — Ambulatory Visit: Payer: Medicaid Other | Attending: Family Medicine | Admitting: Family Medicine

## 2016-04-14 VITALS — BP 104/60 | HR 86 | Temp 98.2°F | Resp 18 | Ht <= 58 in | Wt 173.0 lb

## 2016-04-14 DIAGNOSIS — O24410 Gestational diabetes mellitus in pregnancy, diet controlled: Secondary | ICD-10-CM

## 2016-04-14 DIAGNOSIS — Z20828 Contact with and (suspected) exposure to other viral communicable diseases: Secondary | ICD-10-CM | POA: Insufficient documentation

## 2016-04-14 DIAGNOSIS — Z20821 Contact with and (suspected) exposure to Zika virus: Secondary | ICD-10-CM

## 2016-04-14 DIAGNOSIS — O360120 Maternal care for anti-D [Rh] antibodies, second trimester, not applicable or unspecified: Secondary | ICD-10-CM

## 2016-04-14 DIAGNOSIS — O24414 Gestational diabetes mellitus in pregnancy, insulin controlled: Secondary | ICD-10-CM

## 2016-04-14 DIAGNOSIS — O2342 Unspecified infection of urinary tract in pregnancy, second trimester: Secondary | ICD-10-CM

## 2016-04-14 DIAGNOSIS — O0990 Supervision of high risk pregnancy, unspecified, unspecified trimester: Secondary | ICD-10-CM | POA: Insufficient documentation

## 2016-04-14 DIAGNOSIS — Z3A20 20 weeks gestation of pregnancy: Secondary | ICD-10-CM

## 2016-04-14 DIAGNOSIS — O9989 Other specified diseases and conditions complicating pregnancy, childbirth and the puerperium: Secondary | ICD-10-CM | POA: Insufficient documentation

## 2016-04-14 DIAGNOSIS — O99891 Other specified diseases and conditions complicating pregnancy: Secondary | ICD-10-CM

## 2016-04-14 DIAGNOSIS — O09522 Supervision of elderly multigravida, second trimester: Secondary | ICD-10-CM

## 2016-04-14 LAB — UA, CHEM ONLY POCT
Bilirubin: NEGATIVE
Blood: NEGATIVE
Ketones: NEGATIVE
Leuk Esterase: NEGATIVE
Nitrite: NEGATIVE
Protein: NEGATIVE
Specific Gravity: 1.025 (ref 1.002–1.030)
Urobilinogen: 0.2 (ref 0.2–1)
pH: 6 (ref 5.0–8.0)

## 2016-04-14 NOTE — Progress Notes (Signed)
Theresa Pollard is a 35 year old female G5P4004 at gestational age 5942w5d Estimated Date of Delivery: 08/27/16    Pt of Diabob  Diabetes type:GDMa2 dx at 15 weeks via 1-hr GTT 229 mg/dl.  Wt: 173 lbs    Chief Complaint   Patient presents with   . Routine Prenatal Visit     Times of Day Glucose Tested & Results    Nov/Dec 27 28 29 30 1 2 3    Fasting   121 118 134 118 82 119    After Bkfast   111 99 86 103   105   After Lunch   185 100     91   After Dinner   121 140 147 121 140 106 181   Bedtime            Early AM   (0200-0300)             Dec 4 5 6 7 8 9 10  Current Med Med Changes   Fasting 115 127      NPH 12 units, Novolog 12 units with, breakfast No change   After Bkfast            After Lunch 97       None None   After Dinner 141       Novolog 8 units with, dinner Novolog 10 units with, dinner   Bedtime        NPH 14 units, At 10 PM or later NPH 18 units, At 10 PM or later   Early AM   (0200-0300)              Assessment / Plan:    1. Diabetes: Pt with elevated FBG and dinner values. Pt denies any hypoglycemia events. Insulin changes made per MD order. Stressed to pt importance of sending BG when no MD appt. Pt missing many BG values esp this past week pt states she just forgets. She is setting alarm but knows she needs to listen to it to actually check BG and to send BG logs. Long discussion about meal timing and content since pt feels full and does not want ot eat much.     2. Routine OB/Fetus: Diabob 4 weeks, genetics screen with previous provider, anatomy scan at [facility] later this week    3. Education discussed: Education: maternal risks and fetal risks  Nutrition: pregnancy nutrition and meal composition  Blood glucose / urine monitoring:  blood glucose goals, testing frequency and how/when to report values  Fetal Surveillance: ultrasound  Medication: type / action, dose / administration and adjustments  Hypoglycemia: signs / symptoms and treatment / prevention    Plan of care discussed with patient and patient  verbalized understanding: Yes    Time spent with patient 15 minutes.

## 2016-04-14 NOTE — Telephone Encounter (Signed)
Pt seen today in Diabob.

## 2016-04-16 LAB — URINE CULTURE

## 2016-04-19 DIAGNOSIS — E669 Obesity, unspecified: Secondary | ICD-10-CM

## 2016-04-24 LAB — LAB MISC TEST

## 2016-04-25 ENCOUNTER — Telehealth (HOSPITAL_BASED_OUTPATIENT_CLINIC_OR_DEPARTMENT_OTHER): Payer: Self-pay

## 2016-04-25 NOTE — Telephone Encounter (Signed)
Dr Shane Crutchosenblum asked us to call to let pt know that Zika testing is negative. Recommend she avoid travel to Zika-endemic areas (anywhere in [place]); if she must travel, recommend strict mosquito prevention. If partner travels, recommend condoms for remainder of pregnancy.     I was not able to reach pt, VM did not have her name on it so just left generic message asking pt to call back for lab results.

## 2016-04-30 ENCOUNTER — Telehealth (HOSPITAL_BASED_OUTPATIENT_CLINIC_OR_DEPARTMENT_OTHER): Payer: Self-pay

## 2016-04-30 NOTE — Telephone Encounter (Signed)
Diabob pt with GDMa2 @ 5148w0d on insulin last seen a couple of weeks ago. Pt was to have sent in BG for review has yet to do so. Left message for pt to send. Pt has pending Diabob appt 05/12/16.

## 2016-05-01 NOTE — Telephone Encounter (Signed)
2nd VM left to reach pt.  Requested a call back if this number is incorrect.

## 2016-05-01 NOTE — Telephone Encounter (Signed)
Was able to speak to patient and relayed info as requested by Dr. Shane Crutchosenblum.  Pt verbalized understanding.

## 2016-05-10 NOTE — Progress Notes (Unsigned)
Theresa Pollard is a 36 year old female with G5P4004 at [redacted]w[redacted]d([place] 9 wk [place] w/GDMA2 (vs DM2), AMA, UTI, Rh neg, travel to Zika-infected area, obesity, and rubella non-immune, who presents for routine prenatal care.    [place] Denies symptoms of acute Zika infection in past 12 weeks (no rash, fever, conjunctivitis, arthralgias/myalgias). ***    [place]25[place]17 (see report under Media): 928w1dEDD 08/27/16    A/P:    DM:  1 hr GTT = 229 (03/06/16). A1C 6.1 (03/24/16). BS log reviewed - ***. Taking ASA. Optho consult pending ***. Baseline preeclamptic labs WNL  FWB: anatomic [facility] [facility] ***  AMA: SIS neg x 4.   Rh negative: plan Rhogam after 28 wks  Hx UTI: >100K E Coli, treated w/Keflex. Needs TOC - ordered  Zika virus exposure: oral and written information provided on importance of condoms and prevention of mosquito bites. Per CDC protocol, needs testing x 3 in pregnancy. Urine and blood Zika NAAT testing neg 04/23/16. Plan to repeat testing in 3rd trimester ***  Rubella non-Immune: plan MMR in postpartum period  MOD: prior SVD x 4. Largest baby 7#. Delivery planning after 36 wk scan.  MOC: leaning towards permanent sterilization, consent signed today. Pt aware she can change her mind 'but I don't think I will.' Written information provided.  Routine PN Labs: reviewed. Plan CBC in 3rd trimester  Immunz: s/p flu vaccine. Plan Tdap after 27 wks  Psychosocial: SW assessment of self-questionairre: no concerns.  Breast pump: plan to order in 3rd trimester

## 2016-05-12 ENCOUNTER — Encounter (HOSPITAL_BASED_OUTPATIENT_CLINIC_OR_DEPARTMENT_OTHER): Payer: Medicaid Other | Admitting: Family Medicine

## 2016-05-12 NOTE — Progress Notes (Unsigned)
No show

## 2016-05-13 ENCOUNTER — Telehealth (HOSPITAL_BASED_OUTPATIENT_CLINIC_OR_DEPARTMENT_OTHER): Payer: Self-pay

## 2016-05-13 NOTE — Telephone Encounter (Signed)
Diabob patient did not come to her appointment on 05/12/16, attempted to contact patient @ [phone] and phone call got "dropped" several times.

## 2016-05-15 ENCOUNTER — Telehealth (HOSPITAL_BASED_OUTPATIENT_CLINIC_OR_DEPARTMENT_OTHER): Payer: Self-pay

## 2016-05-15 NOTE — Telephone Encounter (Signed)
Left voice mail and sent email to pt to reschedule MD appt and to pick up study meds from DAPP office. Pt can be added to HROB AM schedule as ROB on 05/20/16 per Aggie Cosierheresa if she calls back to schedule appt.

## 2016-05-15 NOTE — Telephone Encounter (Signed)
Pt called back she is scheduled for 05/26/16 she is unable to come in 05/20/16. Pt will come in tomorrow to pick up meds around 3 PM.

## 2016-05-15 NOTE — Telephone Encounter (Signed)
Pt calling stating she has an ROB 1/22 and will pick up study meds that day.  Called pt back to let her know she needs to pick up the meds asap.  Offered open HROB time for pt to come in.  She can not.  Asked if pt can come by today or tomorrow to pick up the meds.  She will come after 2:30p tomorrow.  Pt aware that we are only her till 4:30p.  Asked pt reviewed blood glucose.  Pt states that she is driving and can not right now but will drop them off tomorrow.

## 2016-05-15 NOTE — Telephone Encounter (Signed)
[facility] calling to remind that the Pt need to have another blood specimen drawn for Zika testing. States needs to have the drawn prior to 07/07/16. Theresa Pollard can be reached at [phone]. Please advise

## 2016-05-20 NOTE — Telephone Encounter (Signed)
Pt did not come by last week to pick up study meds. Sent email per pts preference to pt on needing to pick up now instead of waiting for MDa ppt on 05/26/16.

## 2016-05-21 ENCOUNTER — Telehealth (HOSPITAL_BASED_OUTPATIENT_CLINIC_OR_DEPARTMENT_OTHER): Payer: Self-pay

## 2016-05-21 ENCOUNTER — Ambulatory Visit: Payer: Medicaid Other

## 2016-05-21 DIAGNOSIS — O24414 Gestational diabetes mellitus in pregnancy, insulin controlled: Principal | ICD-10-CM

## 2016-05-21 DIAGNOSIS — O0992 Supervision of high risk pregnancy, unspecified, second trimester: Secondary | ICD-10-CM

## 2016-05-21 DIAGNOSIS — O24410 Gestational diabetes mellitus in pregnancy, diet controlled: Secondary | ICD-10-CM

## 2016-05-21 MED ORDER — INSULIN ASPART 100 UNIT/ML SC SOLN
SUBCUTANEOUS | 9 refills | Status: DC
Start: 2016-05-21 — End: 2016-06-09

## 2016-05-21 MED ORDER — INSULIN ISOPHANE HUMAN 100 UNIT/ML SC SUSP
SUBCUTANEOUS | 9 refills | Status: DC
Start: 2016-05-21 — End: 2016-07-23

## 2016-05-21 NOTE — Telephone Encounter (Signed)
Pt came in today to pick up meds and to review BG. See other telephone encounter for BG review and meds.

## 2016-05-21 NOTE — Progress Notes (Signed)
Theresa Pollard is a 36 year old female G5P4004 at gestational age 1826w0d Estimated Date of Delivery: 08/27/16    Pt of Diabob  Diabetes type:GDMa2 dx at 15 weeks via 1-hr GTT 229 mg/dl.  Wt: 173 lbs    No chief complaint on file.    Times of Day Glucose Tested & Results    Jan 2 3 4 5 6 7 8    Fasting   116 112 123 114 123 97    After Bkfast   162 115 92 107 123 118 132   After Lunch   108 104 118 105 97 83 134   After Dinner   130 155 142 184 95 124 166   Bedtime            Early AM   (0200-0300)             Jan 9 10 11 12 13 14 15 16 17  Current Med Med Changes   Fasting 108 112 150  118 112 125 126 112 NPH 12 units, Novolog 12 units with, breakfast NPH 14 units, Novolog 12 units with, breakfast   After Bkfast              After Lunch 98 98 150 156 155 132 83 108  None None   After Dinner 146 153 136 196 160 171 156 210  Novolog 10 units with, dinner Novolog 14 units with, dinner   Bedtime          NPH 16 units, At 10 PM or later NPH 20 units, At 10 PM or later   Early AM   (0200-0300)                Assessment / Plan:    1. Diabetes: Pt. Has not sent blood sugars since 04/14/16.  Pt. in clinic to pick up study meds,but brought blood sugar log. Pt. In a rush and refuses to sit down to do a full CDE review. Able to quickly review log with patient. Elevated fastings, lunch and dinners noted.  Pt missing all breakfast  BG values  this week pt states she just forgets. Reviewed ideas to remember better. Pt denies any hypoglycemia events. Insulin changes made per MD order. Stressed to pt importance of sending BG weekly.  Counseled about repercussions of untreated DM in pregnancy. Risk of macrosomia, traumatic delivery, need for cesarean discussed, and risk of neonatal metabolic complications such hypoglycemia, hyperbilirubinemia, NICU stay, and stillbirth discussed. Pt. verbalizes understanding.      2. Routine OB/Fetus: Diabob 05/26/16.  Recommend Growth US around 30 weeks-order in,reminded pt. To call and scheduled.  Today,pt.  brought results from Anatomy US on 04/19/17-results wnl. Due to pt. Being in a ush,[facility]t.    3. Education discussed: Education: maternal risks and fetal risks  Blood glucose / urine monitoring:  blood glucose goals, testing frequency and how/when to report values  Fetal Surveillance: ultrasound  Medication: , dose / administration and adjustments  Hypoglycemia: signs / symptoms and treatment / prevention    Plan of care discussed with patient and patient verbalized understanding: Yes    Time spent with patient 15 minutes.

## 2016-05-21 NOTE — Telephone Encounter (Signed)
Opened in error

## 2016-05-21 NOTE — Progress Notes (Signed)
Glycemic control and medications reviewed.  Medication changes per my orders.  Sandy Guiliana Shor

## 2016-05-25 NOTE — Progress Notes (Signed)
Theresa Pollard is a 36 year old female with G5P4004 at [redacted]w[redacted]d([place] 9 [place]/GD[place] (vs DM2), AMA, UTI, Rh neg, travel to Zika-infected area, obesity, and rubella non-immune, who presents for routine prenatal care.    [place] Denies symptoms of acute Zika infection in past 12 weeks (no rash, fever, conjunctivitis, arthralgias/myalgias).    No report of cramping/ctx, no leakage of fluid, no vaginal bleeding, no HA/blurred vision. +Fatigue, not certain how much longer she can keep working.    Vitals:    05/26/16 1546   BP: 95/58   BP Location: Left arm   BP Patient Position: Sitting   BP cuff size: Regular   Pulse: 79   Temp: 97.8 F (36.6 C)   TempSrc: Oral   Weight: 81.2 kg (179 lb)   Height: _0  (1.397 m)     WDWN female, NAD  FHT 160    A/P:    DM:  1 hr GTT = 229 (03/06/16). A1C 6.1 (03/24/16). BS log reviewed. Med changes per CDE note. Taking ASA. Optho consult in past 12 months. Baseline preeclamptic labs WNL    [facility] S[facility]D, normal anatomic survey, limited cardiac views (LVOT, RVOT, IVC, pulmonic arch not clearly seen). Plan fetal Echo - ordered     AMA: SIS neg x 4.     Rh negative: plan Rhogam after 28 wks, next visit    Hx UTI: >100K E Coli, treated w/Keflex. Urine TOC 04/14/16 neg.    Zika virus exposure: oral and written information provided on importance of condoms and prevention of mosquito bites. Per CDC protocol, needs testing x 3 in pregnancy. Urine and blood Zika NAAT testing neg 04/23/17.    Rubella non-Immune: plan MMR in postpartum period    MOD: prior SVD x 4. Largest baby 7#. Delivery planning after 36 wk scan.    MOC: leaning towards permanent sterilization, consent signed today. Pt aware she can change her mind 'but I don't think I will.' Written information provided.    Routine PN Labs: Outside labs reviewed. Pap 03/07/16: [place] neg. Plan CBC in 3rd  trimester    Immunz: s/p flu vaccine. Plan Tdap after 27 wks    Psychosocial: SW assessment of self-questionairre: no concerns.    Breast pump: plan to order in 3rd trimester    ETruddie Hidden MD  Signature Derived From Controlled Access Password, May 26, 2016, 4:24 PM

## 2016-05-26 ENCOUNTER — Ambulatory Visit: Payer: Medicaid Other | Attending: Family Medicine | Admitting: Family Medicine

## 2016-05-26 DIAGNOSIS — O09522 Supervision of elderly multigravida, second trimester: Secondary | ICD-10-CM

## 2016-05-26 DIAGNOSIS — O360130 Maternal care for anti-D [Rh] antibodies, third trimester, not applicable or unspecified: Secondary | ICD-10-CM

## 2016-05-26 DIAGNOSIS — O99212 Obesity complicating pregnancy, second trimester: Secondary | ICD-10-CM

## 2016-05-26 DIAGNOSIS — O24414 Gestational diabetes mellitus in pregnancy, insulin controlled: Principal | ICD-10-CM

## 2016-05-26 DIAGNOSIS — O0992 Supervision of high risk pregnancy, unspecified, second trimester: Secondary | ICD-10-CM

## 2016-05-26 DIAGNOSIS — Z3A26 26 weeks gestation of pregnancy: Secondary | ICD-10-CM

## 2016-05-26 NOTE — Progress Notes (Signed)
Theresa Pollard is a 36 year old female G5P4004 at gestational age 8365w0d Estimated Date of Delivery: 08/27/16    Pt of Diabob  Diabetes type:GDMa2 dx at 15 weeks via 1-hr GTT 229 mg/dl.  Wt: 173 lbs  MOMPOD study  No chief complaint on file.    Times of Day Glucose Tested & Results      Jan 18 19 20 21 22  Med changes Med Changes  05/22/16 Med Changes  05/26/16   Fasting 114 139 137   NPH 12 units, Novolog 12 units with, breakfast NPH 14 units, Novolog 12 units with, breakfast none   After Bkfast 111 105 113 122 125      After Lunch 122 142 120 118 93 None none none   After Dinner 133 141 176 171  Novolog 10 units with, dinner Novolog 14 units with, dinner Novolog 18 units with, dinner   Bedtime      NPH 16 units, At 10 PM or later NPH 20 units, At 10 PM or later NPH 26 units, At 10 PM or later   Early AM   (0200-0300)             Assessment / Plan:    1. Diabetes: Reviewed blood glucose log. Denies hypoglycemia. Elevated fastings and 1hr pp dinners. Lunch values improved with changes last week. Requested patient check blood sugars around 2-3am to assess for somogyi effect. Med changes made per MD order.     2. Routine OB/Fetus: Diabob 2 weeks  Recommend Growth US around 30 weeks-order in,reminded pt. to call and scheduled.  Today, pt. brought results from Anatomy US on 04/19/17-results wnl. [facility]. Gave her a record book, explained purpose, and how to preform. REcommend daily FMC start at 28 weeks    3. Education discussed:   Nutrition: meal composition  Exercise:  type and frequency and timing and duration  Blood glucose / urine monitoring:  blood glucose goals, testing frequency and how/when to report values  Fetal Surveillance: ultrasound and fetal movement count instruction  Medication: dose / administration, injection site / rotation, record keeping and adjustments  Hypoglycemia: signs / symptoms and treatment / prevention      Plan of care discussed with patient and patient verbalized understanding:  Yes    Time spent with patient 15 minutes.

## 2016-05-27 ENCOUNTER — Telehealth (HOSPITAL_BASED_OUTPATIENT_CLINIC_OR_DEPARTMENT_OTHER): Payer: Self-pay | Admitting: Family Medicine

## 2016-05-27 NOTE — Telephone Encounter (Signed)
Internal or External: INTERNAL   Any abnormalities? If so, what?:    Type of ultrasound requesting: FOLLOW UP AND FETAL ECHO   Gestational age: 6872W6D  Recommended timeframe:   Date of scheduled ultrasound? NOT SCHEDULED   On wait list?     [facility], is that enough records to be able to schedule her fetal echo??   And can we schedule her f/up or does she need anatomy with us.

## 2016-05-28 NOTE — Telephone Encounter (Signed)
Hi Nidia,    I spoke to [facility]. This patient needs an anatomy scan with us before we schedule the echo. They recommended an echo based off of suboptimal heart views and GDM.    Holding U60 SLOT  05/29/16  RM 7 AT 1230 PM

## 2016-05-28 NOTE — Telephone Encounter (Signed)
Called patient no answer and left VM, see previous encounter notes.

## 2016-05-29 ENCOUNTER — Encounter (HOSPITAL_BASED_OUTPATIENT_CLINIC_OR_DEPARTMENT_OTHER): Payer: Self-pay | Admitting: Gynecology

## 2016-05-29 DIAGNOSIS — O0992 Supervision of high risk pregnancy, unspecified, second trimester: Secondary | ICD-10-CM

## 2016-05-29 DIAGNOSIS — O24414 Gestational diabetes mellitus in pregnancy, insulin controlled: Principal | ICD-10-CM

## 2016-05-30 ENCOUNTER — Ambulatory Visit: Payer: Medicaid Other | Attending: Gynecology

## 2016-05-30 DIAGNOSIS — O24419 Gestational diabetes mellitus in pregnancy, unspecified control: Secondary | ICD-10-CM | POA: Insufficient documentation

## 2016-05-30 DIAGNOSIS — Z3A27 27 weeks gestation of pregnancy: Secondary | ICD-10-CM | POA: Insufficient documentation

## 2016-05-30 DIAGNOSIS — O0990 Supervision of high risk pregnancy, unspecified, unspecified trimester: Principal | ICD-10-CM | POA: Insufficient documentation

## 2016-05-30 DIAGNOSIS — O09522 Supervision of elderly multigravida, second trimester: Secondary | ICD-10-CM | POA: Insufficient documentation

## 2016-05-30 DIAGNOSIS — O24410 Gestational diabetes mellitus in pregnancy, diet controlled: Secondary | ICD-10-CM | POA: Insufficient documentation

## 2016-05-30 NOTE — Procedures (Signed)
PATIENT HAD AN ULTRASOUND AND AN AVS WAS PROVIDED.

## 2016-06-07 MED ORDER — RHO D IMMUNE GLOBULIN 1500 UNITS IM SOSY
300.0000 ug | PREFILLED_SYRINGE | Freq: Once | INTRAMUSCULAR | Status: AC
Start: 2016-06-07 — End: 2016-06-08

## 2016-06-07 NOTE — Progress Notes (Signed)
Theresa Pollard is a 36 year old female with G5P4004 at [redacted]w[redacted]d ([place] 9 [place]/GDMA2 (vs DM2), AMA, UTI, Rh neg, travel to Zika-infected area,obesity, and rubella non-immune, who presents for routine prenatal care.    No report of cramping/ctx, no leakage of fluid, no vaginal bleeding, no HA/blurred vision. +Fatigue, not certain how much longer she can keep working. 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     [place] in past 12 weeks (no rash, fever, conjunctivitis, arthralgias/myalgias).       06/09/16  1543   BP: 108/68   Pulse: 85   Resp: 16   Temp: 97.9 F (36.6 C)   SpO2: 97%     Obese female, NAD  FHT 140    [place]  S=D, no anomalies identified.    A/P:    DM: 1 hr GTT = 229 (03/06/16). A1C 6.1 (03/24/16). BS log reviewed . Taking ASA. Optho consult in past 12 months. Baseline preeclamptic labs WNL    [facility], normal anatomic survey, limited cardiac views. Follow up [place]17 - cardiac anatomy cleared.    AMA: SIS neg x 4.     Rh negative: Rhogam ordered today    Hx UTI:>100K E Coli, treated w/Keflex. Urine TOC 04/14/16 neg.    Zika virus exposure: oral and written information provided on importance of condoms and prevention of mosquito bites. Per CDC protocol, needs testing x 3 in pregnancy. Urine and blood Zika NAAT testing neg 04/23/17. Repeat Zika testing ordered today    Rubella non-Immune:plan MMR in postpartum period    MOD: prior SVD x 4. Largest baby 7#. Delivery planning after 36 wk scan.    MOC: leaning towards permanent sterilization, consent signed 04/17/16. Pt aware she can change her mind 'but I don't think I will.' Written information provided.    Routine PN Labs: Outside labs reviewed. Pap 03/07/16: [place] HPV neg. CBC ordered today    Immunz: s/p flu vaccine. Tdap  ordered today    Psychosocial: SW assessment of self-questionairre: no concerns.    Breast pump: Ordered     ETruddie Hidden MD  Signature Derived From Controlled Access Password, June 09, 2016, 10:36 PM

## 2016-06-09 ENCOUNTER — Ambulatory Visit: Payer: Medicaid Other | Attending: Family Medicine | Admitting: Family Medicine

## 2016-06-09 VITALS — BP 108/68 | HR 85 | Temp 97.9°F | Resp 16 | Ht <= 58 in | Wt 181.0 lb

## 2016-06-09 DIAGNOSIS — O24410 Gestational diabetes mellitus in pregnancy, diet controlled: Secondary | ICD-10-CM | POA: Insufficient documentation

## 2016-06-09 DIAGNOSIS — O09899 Supervision of other high risk pregnancies, unspecified trimester: Principal | ICD-10-CM | POA: Insufficient documentation

## 2016-06-09 DIAGNOSIS — O26899 Other specified pregnancy related conditions, unspecified trimester: Secondary | ICD-10-CM

## 2016-06-09 DIAGNOSIS — Z789 Other specified health status: Secondary | ICD-10-CM | POA: Insufficient documentation

## 2016-06-09 DIAGNOSIS — O24414 Gestational diabetes mellitus in pregnancy, insulin controlled: Secondary | ICD-10-CM | POA: Insufficient documentation

## 2016-06-09 DIAGNOSIS — Z130 Encounter for screening for diseases of the blood and blood-forming organs and certain disorders involving the immune mechanism: Secondary | ICD-10-CM | POA: Insufficient documentation

## 2016-06-09 DIAGNOSIS — O09522 Supervision of elderly multigravida, second trimester: Secondary | ICD-10-CM

## 2016-06-09 DIAGNOSIS — Z3A28 28 weeks gestation of pregnancy: Secondary | ICD-10-CM

## 2016-06-09 DIAGNOSIS — O0993 Supervision of high risk pregnancy, unspecified, third trimester: Secondary | ICD-10-CM | POA: Insufficient documentation

## 2016-06-09 DIAGNOSIS — Z87440 Personal history of urinary (tract) infections: Secondary | ICD-10-CM

## 2016-06-09 DIAGNOSIS — O360120 Maternal care for anti-D [Rh] antibodies, second trimester, not applicable or unspecified: Secondary | ICD-10-CM

## 2016-06-09 DIAGNOSIS — Z006 Encounter for examination for normal comparison and control in clinical research program: Secondary | ICD-10-CM | POA: Insufficient documentation

## 2016-06-09 DIAGNOSIS — Z23 Encounter for immunization: Secondary | ICD-10-CM | POA: Insufficient documentation

## 2016-06-09 DIAGNOSIS — Z6791 Unspecified blood type, Rh negative: Secondary | ICD-10-CM

## 2016-06-09 MED ORDER — RHO D IMMUNE GLOBULIN 1500 UNITS IM SOSY
300.0000 ug | PREFILLED_SYRINGE | Freq: Once | INTRAMUSCULAR | Status: AC
Start: 2016-06-09 — End: 2016-06-09
  Administered 2016-06-09: 300 ug via INTRAMUSCULAR

## 2016-06-09 MED ORDER — INSULIN ASPART 100 UNIT/ML SC SOLN
SUBCUTANEOUS | 9 refills | Status: DC
Start: 2016-06-09 — End: 2016-07-23

## 2016-06-09 NOTE — Progress Notes (Signed)
Theresa Pollard is a 36 year old female G5P4004 at gestational age 6852w5d Estimated Date of Delivery: 08/27/16    Pt of Diabob  Diabetes type:GDMa2 dx at 15 weeks via 1-hr GTT 229 mg/dl.  Wt: 181 lbs  MOMPOD study  No chief complaint on file.    Times of Day Glucose Tested & Results    Jan 23 24 25 26 27    Fasting 116 114 82 125 152   After Bkfast 112 92 101 89 125   After Lunch 165 98 103 132 128   After Dinner 98 240 205 189 155   Bedtime        Early AM   (0200-0300)           Jan/Feb 28 29 30 31 1 2 3 4 5  Med Changes  05/22/16 Med Changes  05/26/16 Med Changes  06/09/16   Fasting 134 106 125 123 126 131 126  116 NPH 14 units, Novolog 12 units with, breakfast none none   After Bkfast 94 96 92 98 110 112 90 109 110      After Lunch 163 110 145 124 144 132 146 121 101 none none Novolog 6 units   After Dinner 199 157 179 165 214 159 141 179  Novolog 14 units with, dinner Novolog 18 units with, dinner Novolog 24 units with, dinner   Bedtime          NPH 20 units, At 10 PM or later NPH 26 units, At 10 PM or later NPH 32 units, At 10 PM or later   Early AM   (0200-0300)                 Assessment / Plan:    1. Diabetes: Reviewed blood glucose log,does not have meter at appt. Denies hypoglycemia. Elevated fastings,1hr PP lunch and dinners. Pt. tearful about elevations. Reviewed how placenta hormones are causing a increased insulin resistances. Encouraged pt. that blood sugars can get lower, but she needs to try send her blood sugars to Diabob more frequently.  Med changes made. Pt agrees to send again later this week    2. Routine OB/Fetus: Diabob 06/30/16. Growth US 05/30/16 (HC-9%tile,AC-65%tile,EFW- 38%tile). FMC-WNL.    3. Education discussed:   Nutrition: meal composition  Exercise:  type and frequency and timing and duration  Blood glucose / urine monitoring:  blood glucose goals, testing frequency and how/when to report values  Fetal Surveillance: ultrasound and fetal movement count instruction  Medication: dose / administration,  injection site / rotation, record keeping and adjustments  Hypoglycemia: signs / symptoms and treatment / prevention      Plan of care discussed with patient and patient verbalized understanding: Yes    Time spent with patient 15 minutes.

## 2016-06-24 ENCOUNTER — Telehealth (HOSPITAL_BASED_OUTPATIENT_CLINIC_OR_DEPARTMENT_OTHER): Payer: Self-pay

## 2016-06-24 NOTE — Telephone Encounter (Signed)
Theresa Pollard is a 36 year old female G5P4004 at gestational age 315w6d Estimated Date of Delivery: 08/27/16    Pt of Diabob  Diabetes type:GDMa2 dx at 15 weeks via 1-hr GTT 229 mg/dl, W0JA1c 6.1 on 81/19/1411/20/17  Wt: 181 lbs    Chief Complaint   Patient presents with    Diabetes     Blood glucose review     Times of Day Glucose Tested & Results    Feb 6 7 8 9 10 11 12    Fasting   112 174 121 145   111   After Bkfast   110 107 93 115 115 108 113   After Lunch   119 121 86 135 150 149 142   After Dinner   220 202 229 213 222 135 181   Bedtime            Early AM   (0200-0300)             Feb 13 14 15 16 17 18 19  Med Changes  05/26/16 Med changes 06/09/16 Med changes 06/24/16   Fasting 113 99 147 109 126 114 125 NPH 14 units, Novolog 12 units, MOMPOD 2 tablets with breakfast No change NPH 18 units, Novolog 12 units, MOMPOD 2 tablets with breakfast   After Bkfast 110 115 109 121 118 105 116      After Lunch 168 151 149 161 171 159 161 None Novolog 6 units with lunch Novolog 10 units with lunch   After Dinner 128 199 208 175 202 162 207 Novolog 18 units MOMPOD 2 tablets with dinner Novolog 24 units MOMPOD 2 tablets with dinner Novolog 32 units MOMPOD 2 tablets with dinner   Bedtime        NPH 26 units, At 10 PM or later NPH 32 units, At 10 PM or later NPH 40 units, At 10 PM or later   Early AM   (0200-0300)               Assessment / Plan:    1. Diabetes: Elevated BG throughout. Confirmed insulin and MOMPOD dosing with pt. Pt denies any hypoglycemia vents. Insulin changes made per MD order. New rule of 2's given for after meals.     2. Routine OB/Fetus: Diabob 06/30/16, sono @ 36 weeks, NST twice a week starting @ 34 weeks, FM not noted asked pt to add to log sheet     3. Education discussed:   Blood glucose / urine monitoring:  blood glucose goals, testing frequency and how/when to report values  Fetal Surveillance: ultrasound and fetal movement count instruction  Medication: dose / administration, injection site / rotation, record keeping  and adjustments  Hypoglycemia: signs / symptoms and treatment / prevention    Plan of care discussed with patient and patient verbalized understanding: Yes

## 2016-06-24 NOTE — Telephone Encounter (Signed)
Agree with changes.    [place] [id number]  Maternal-Fetal Medicine Fellow  Pager: [phone]

## 2016-06-24 NOTE — Telephone Encounter (Signed)
Diabob pt with Type 2 DM @ 4473w6d on MOMPOD and insulin last seen a couple of weeks ago. Pt was to have sent in BG for review has yet to do so. Left message for pt to send. Pt has pending Diabob appt 06/30/16.

## 2016-06-30 ENCOUNTER — Encounter (HOSPITAL_BASED_OUTPATIENT_CLINIC_OR_DEPARTMENT_OTHER): Payer: Self-pay | Admitting: Obstetrics & Gynecology

## 2016-06-30 ENCOUNTER — Other Ambulatory Visit (HOSPITAL_BASED_OUTPATIENT_CLINIC_OR_DEPARTMENT_OTHER): Payer: Self-pay | Admitting: Gynecology

## 2016-06-30 ENCOUNTER — Encounter (HOSPITAL_BASED_OUTPATIENT_CLINIC_OR_DEPARTMENT_OTHER): Payer: Medicaid Other | Admitting: Family Medicine

## 2016-06-30 ENCOUNTER — Ambulatory Visit: Payer: Medicaid Other | Attending: Obstetrics & Gynecology | Admitting: Obstetrics & Gynecology

## 2016-06-30 DIAGNOSIS — O24414 Gestational diabetes mellitus in pregnancy, insulin controlled: Principal | ICD-10-CM

## 2016-06-30 DIAGNOSIS — O09523 Supervision of elderly multigravida, third trimester: Secondary | ICD-10-CM

## 2016-06-30 DIAGNOSIS — O26899 Other specified pregnancy related conditions, unspecified trimester: Secondary | ICD-10-CM

## 2016-06-30 DIAGNOSIS — O09893 Supervision of other high risk pregnancies, third trimester: Secondary | ICD-10-CM

## 2016-06-30 DIAGNOSIS — O0993 Supervision of high risk pregnancy, unspecified, third trimester: Secondary | ICD-10-CM

## 2016-06-30 DIAGNOSIS — Z6791 Unspecified blood type, Rh negative: Secondary | ICD-10-CM

## 2016-06-30 DIAGNOSIS — N3000 Acute cystitis without hematuria: Secondary | ICD-10-CM

## 2016-06-30 DIAGNOSIS — Z20821 Contact with and (suspected) exposure to Zika virus: Secondary | ICD-10-CM

## 2016-06-30 MED ORDER — METFORMIN/PLACEBO IRB 16-0660 500 MG
0 refills | Status: DC
Start: 2016-06-30 — End: 2017-06-29

## 2016-06-30 NOTE — Patient Instructions (Addendum)
Obstetrical Outpatient Discharge Instructions:  Labor & Deliverys Phone #: [phone] - [facility]   Clinic Phone #: [phone]    Please schedule an appointment to be seen in 2 weeks.     Please make an appointment for ultrasound in 4 weeks.    Please go to the lab for blood work today.     Please follow the instructions for:    FETAL KICK COUNTS    1. Count the babys movement every night.  2. A movement may be a kick, swish or roll. Do not count hiccups or small flutters.  3. Count babys movements while lying down, preferably on your left side,  preferably after a meal.  4. Mark down the time you feel the baby move for the first time.  5. Mark down the time you feel the tenth fetal movement.  6. You should feel at least 10 fetal movements within one hour.  7. Call Labor and Delivery immediately if:  a. You do not feel 10 movements within one hour  b. It takes longer and longer for your baby to move 10 times  c. You have not felt your baby move all day.          PRETERM LABOR PRECAUTIONS    1. Regular uterine tightening  2. Low, dull backache.  3. Menstrual cramps  4. Pressure in your pelvis  5. Leaking or gushing fluid from your vagina.  6. Changes in vaginal discharge; watery, bloody or mucousy.            No future appointments.

## 2016-06-30 NOTE — Progress Notes (Signed)
Theresa Pollard is a 36 year old female G5P4004 at gestational age [redacted]w[redacted]d Estimated Date of Delivery: 08/27/16    Pt of Diabob  Diabetes type:GDMa2 dx at 15 weeks via 1-hr GTT 229 mg/dl, U9WA1c 6.1 on 11/91/4711/20/17  Wt: 181 lbs    Chief Complaint   Patient presents with    Routine Prenatal Visit     Times of Day Glucose Tested & Results    Feb 20 21 22 23 24 25 26  Med changes 06/09/16 Med changes 06/24/16 Med changes 06/30/16   Fasting @0330  124 143 138 140  109 76 0130 no snack NPH 14 units, Novolog 12 units, MOMPOD 2 tablets with breakfast NPH 18 units, Novolog 12 units, MOMPOD 2 tablets with breakfast NPH 18 units, Novolog 16 units, MOMPOD 2 tablets with breakfast   After Bkfast 162 149 143 203 112 115       After Lunch 99 120 97 112 125 119  Novolog 6 units with lunch Novolog 10 units with lunch No change   After Dinner 239 221 89 242 208 182  Novolog 24 units MOMPOD 2 tablets with dinner Novolog 32 units MOMPOD 2 tablets with dinner Novolog 40 units MOMPOD 2 tablets with dinner   Bedtime        NPH 32 units, At 9 PM or later NPH 40 units, At 9 PM or later NPH 44 units, At 10 PM or later   Early AM   (0200-0300)               Assessment / Plan:    1. Diabetes: Pt with one low FBG noted today @ 0130 she felt symptomatic. Pt not having HS snack reviewed what and when to eat. Pt with elevated BG otherwise most other times of the day. Insulin changes made per MD order. Pt to send BG next week for review.     2. Routine OB/Fetus: Diabob 2 weeks, sono @ 36 weeks, NST twice a week starting @ 34 weeks, FM WNL    3. Education discussed:   Blood glucose / urine monitoring:  blood glucose goals, testing frequency and how/when to report values  Fetal Surveillance: ultrasound and fetal movement count instruction  Medication: dose / administration, injection site / rotation, record keeping and adjustments  Hypoglycemia: signs / symptoms and treatment / prevention    Plan of care discussed with patient and patient verbalized understanding:  Yes    Time spent with patient 15 minutes.

## 2016-06-30 NOTE — Progress Notes (Signed)
MFM Fellow Note    [age] year old G5P4004 at [redacted]w[redacted]d with GDM on insulin.     Sono at 27 wks efw 38%, ac 65%    BP 104/72 (BP Location: Right arm, BP Patient Position: Sitting, BP cuff size: Regular)   Pulse 81   Temp 98.5 F (36.9 C) (Oral)   Ht 4\' 7"  (1.397 m)   Wt 82.1 kg (181 lb)   LMP 11/21/2015 (Exact Date)   BMI 42.07 kg/m2     FHT 140s   Abd soft gravid nontender    A1C today   Growth sono 36 weeks   Antenatal testing at 34 weeks   For BTL, papers signed in media     Theresa ShuttersVictoria [name], MD [phone]  Maternal Fetal Medicine Fellow  Cell [phone]

## 2016-06-30 NOTE — Progress Notes (Signed)
Attending  Patient seen.  Chart, assessment and plan reviewed with Resident.  Theresa Pollard is a 36 year old female with G5P4004 at 6129w5d with GDM.  Medication changes noted.  Continue close surveillance for pre-eclampsia. Growth sono with EFW at 38%ile, growth at 36 weeks ordered..  Strict FKC.  Start NST/AFI at 34 weeks.  A1c is needed.  Theresa Pollard

## 2016-07-12 NOTE — Progress Notes (Signed)
NICKIA BOESEN is a 36 year old G5P4004 at [redacted]w[redacted]d(dated [place] 9 wk [place] w/GDMA2 (vs DM2), AMA, UTI, Rh neg, travel to Zika-infected area,obesity, and rubella non-immune, who presents for routine prenatal care.    No report of cramping/ctx, no leakage of fluid, no vaginal bleeding, no HA/blurred vision. +Fatigue. Works as a wPhysiological scientist      [place] in past 12 weeks (no rash, fever, conjunctivitis, arthralgias/myalgias).      Vitals:    07/14/16 1404   BP: 122/72   BP Location: Right arm   BP Patient Position: Sitting   BP cuff size: Regular   Pulse: 93   Temp: 98.1 F (36.7 C)   TempSrc: Oral   Weight: 84.4 kg (186 lb)   Height: 4' 7"  (1.397 m)       A/P:    DM: 1 hr GTT = 229 (03/06/16). A1C 6.1 (03/24/16). BS log reviewed. Med changes as documented in CDE note.  . Not taking ASA (states she did not know she was supposed to be taking it). Optho consult one month ago - normal per patient report. Baseline preeclamptic labs WNL    [facility] [place] [facility] S[facility], normal anatomic survey, limited cardiac views. Follow up [place]17 - cardiac anatomy cleared. Plan antenatal testing at 34 wks and f/u growth scan at 36 wks - ordered.    AMA: SIS neg x 4.     Rh negative: Rhogam given 2/5    Hx UTI:>100K E Coli, treated w/Keflex. Urine TOC 04/14/16 neg.    Zika virus exposure: oral and written information provided on importance of condoms and prevention of mosquito bites. Per CDC protocol, needs testing x 3 in pregnancy. Urine and blood Zika NAAT testing neg 04/23/17. Repeat Zika testing ordered     Rubella non-Immune:plan MMR in postpartum period    MOD: prior SVD x 4. Largest baby 7#. Delivery planning after 36 wk scan.    MOC: leaning towards permanent sterilization, consent signed 04/17/16. Pt aware she can change her mind 'but I don't think I will.'  Written information provided.    Routine PN Labs: Outside labs reviewed. Pap 03/07/16: [place] HPV neg. CBC ordered last month - not yet drawn - reordered    Immunz: s/p flu vaccine, Tdap    Psychosocial: SW assessment of self-questionairre: no concerns.    Breast pump: Ordered last visit

## 2016-07-14 ENCOUNTER — Encounter (HOSPITAL_BASED_OUTPATIENT_CLINIC_OR_DEPARTMENT_OTHER): Payer: Self-pay | Admitting: Family Medicine

## 2016-07-14 ENCOUNTER — Ambulatory Visit: Payer: Medicaid Other | Attending: Family Medicine | Admitting: Family Medicine

## 2016-07-14 ENCOUNTER — Other Ambulatory Visit (HOSPITAL_BASED_OUTPATIENT_CLINIC_OR_DEPARTMENT_OTHER): Payer: Medicaid Other

## 2016-07-14 VITALS — BP 122/72 | HR 93 | Temp 98.1°F | Ht <= 58 in | Wt 186.0 lb

## 2016-07-14 DIAGNOSIS — O0993 Supervision of high risk pregnancy, unspecified, third trimester: Secondary | ICD-10-CM | POA: Insufficient documentation

## 2016-07-14 DIAGNOSIS — Z130 Encounter for screening for diseases of the blood and blood-forming organs and certain disorders involving the immune mechanism: Secondary | ICD-10-CM | POA: Insufficient documentation

## 2016-07-14 DIAGNOSIS — Z3A33 33 weeks gestation of pregnancy: Secondary | ICD-10-CM

## 2016-07-14 DIAGNOSIS — O24414 Gestational diabetes mellitus in pregnancy, insulin controlled: Secondary | ICD-10-CM | POA: Insufficient documentation

## 2016-07-14 DIAGNOSIS — O09523 Supervision of elderly multigravida, third trimester: Secondary | ICD-10-CM

## 2016-07-14 DIAGNOSIS — Z789 Other specified health status: Secondary | ICD-10-CM

## 2016-07-14 DIAGNOSIS — O360130 Maternal care for anti-D [Rh] antibodies, third trimester, not applicable or unspecified: Secondary | ICD-10-CM

## 2016-07-14 NOTE — Progress Notes (Signed)
Memory Danceloisa is a 36 year old female G5P4004 at gestational age 4538w5d Estimated Date of Delivery: 08/27/16    Pt of Diabob  Diabetes type:GDMa2 dx at 15 weeks via 1-hr GTT 229 mg/dl, Z6XA1c 6.1 on 09/60/4511/20/17  BP 122/72 (BP Location: Right arm, BP Patient Position: Sitting, BP cuff size: Regular)   Pulse 93   Temp 98.1 F (36.7 C) (Oral)   Ht 4\' 7"  (1.397 m)   Wt 84.4 kg (186 lb)   LMP 11/21/2015 (Exact Date)   BMI 43.23 kg/m2    Chief Complaint   Patient presents with    Routine Prenatal Visit     Times of Day Glucose Tested & Results    Mar 6 7 8 9 10 11 12  Med changes 06/24/16 Med changes 06/30/16 Med changes 07/14/16   Fasting @0330  148 121 96 121 102 110 113 NPH 18 units, Novolog 12 units, MOMPOD 2 tablets with breakfast NPH 18 units, Novolog 16 units, MOMPOD 2 tablets with breakfast No change   After Bkfast 115 112 119 132 118 109 126      After Lunch 156  149 125 131 119  Novolog 10 units with lunch No change Novolog 12 units with lunch   After Dinner 125  151 169 147 164  Novolog 32 units MOMPOD 2 tablets with dinner Novolog 40 units MOMPOD 2 tablets with dinner Novolog 46 units  MOMPOD 2 tablets with dinner   Bedtime        NPH 40 units, At 9 PM or later NPH 44 units, At 10 PM or later NPH 50 units at 10 PM or later (split dose)   Early AM   (0200-0300)               Assessment / Plan:    1. Diabetes: elevated FBG, and post lunch and dinner.  Pt denies s/s of hypoglycemia this week.  Will adjust insulin dose per protocol.  Pt agrees with the plan and will split her HS NPH.    2. Routine OB/Fetus: Diabob 2 weeks, sono @ 36 weeks - needs to schedule, NST twice a week starting @ 34 weeks 07/17/16, FM WNL    3. Education discussed:   Blood glucose / urine monitoring:  blood glucose goals, testing frequency and how/when to report values  Fetal Surveillance: ultrasound and fetal movement count instruction  Medication: dose / administration, injection site / rotation, record keeping and adjustments  Hypoglycemia: signs /  symptoms and treatment / prevention    Plan of care discussed with patient and patient verbalized understanding: Yes    Time spent with patient 15 minutes.

## 2016-07-14 NOTE — Progress Notes (Signed)
Medical Student Note   [facility]     This is a 36 year old year old G5P4004 at 2160w6d with GDMA2 (vs DM2), AMA, UTI, Rh neg, travel to Zika-infected area,obesity, and rubella non-immune, who presents for routine prenatal care.    ROS:  Denies any vaginal fluid leakage. Has not noticed any spotting or heavy bleeding. Denies abdominal pressure which she noted in previous pregnancies as well. No epigastric / RUQ pain, Nausea, Vomiting.    Had 4 episodes of headaches in the week of Feb 26-Mar 2. All of which were described as pounding pain associated with spotted vision and stars. She tried using tylenol but the headaches but noted increasing frequency. Thinks it might be associated with stress at work - works at [employer]. Since leaving work, she has no longer had these symptoms. Normal urine production with no dysuria. No seizures.     Bilateral LE edema L>R causing painful ambulation. Has been able to get around the house and do what she desires to do.     Has not noticed any changes in fetal movement. Reports several fetal kick counts every min; "easier to count when he's not kicking".     [place] to [place]. Last visited this past weekend. Denies fevers, rash, conjunctivitis, arthralgia in past 12wks for herself and partner.     Physical Exam:  Temperature:  [98.1 F (36.7 C)] 98.1 F (36.7 C) (03/12 1404)  Blood pressure (BP): (122)/(72) 122/72 (03/12 1404)  Heart Rate:  [93] 93 (03/12 1404)  Pain Score: 0 (03/12 1404)  FHR: 160bpm    Fundus: Appropriate size for >20wk.     Assessment / Plan:   This is a 36 year old year old at [id number] 660w6d by LMP here for routine prenatal care.    - Patient will return to clinic for monitoring twice a week.      For full report, please see note by Dr. Shane Crutchosenblum.  ---   Verlin DikeSudarshan Bhat, MS3

## 2016-07-17 ENCOUNTER — Ambulatory Visit: Payer: Medicaid Other | Attending: Gynecology

## 2016-07-17 ENCOUNTER — Other Ambulatory Visit (HOSPITAL_BASED_OUTPATIENT_CLINIC_OR_DEPARTMENT_OTHER): Payer: Medicaid Other

## 2016-07-17 ENCOUNTER — Encounter (HOSPITAL_BASED_OUTPATIENT_CLINIC_OR_DEPARTMENT_OTHER): Payer: Self-pay | Admitting: Obstetrics & Gynecology

## 2016-07-17 VITALS — BP 114/73 | HR 79

## 2016-07-17 DIAGNOSIS — Z3493 Encounter for supervision of normal pregnancy, unspecified, third trimester: Principal | ICD-10-CM

## 2016-07-17 DIAGNOSIS — O99891 Other specified diseases and conditions complicating pregnancy: Secondary | ICD-10-CM

## 2016-07-17 DIAGNOSIS — O9989 Other specified diseases and conditions complicating pregnancy, childbirth and the puerperium: Principal | ICD-10-CM | POA: Insufficient documentation

## 2016-07-17 DIAGNOSIS — Z20821 Contact with and (suspected) exposure to Zika virus: Secondary | ICD-10-CM

## 2016-07-17 DIAGNOSIS — Z3480 Encounter for supervision of other normal pregnancy, unspecified trimester: Secondary | ICD-10-CM | POA: Insufficient documentation

## 2016-07-17 DIAGNOSIS — O09523 Supervision of elderly multigravida, third trimester: Secondary | ICD-10-CM

## 2016-07-17 DIAGNOSIS — Z20828 Contact with and (suspected) exposure to other viral communicable diseases: Secondary | ICD-10-CM | POA: Insufficient documentation

## 2016-07-17 DIAGNOSIS — O99810 Abnormal glucose complicating pregnancy: Secondary | ICD-10-CM | POA: Insufficient documentation

## 2016-07-17 LAB — CBC WITH DIFF, BLOOD
ANC-Automated: 6.1 10*3/uL (ref 1.6–7.0)
Abs Lymphs: 1.9 10*3/uL (ref 0.8–3.1)
Abs Monos: 0.7 10*3/uL (ref 0.2–0.8)
Eosinophils: 1 %
Hct: 36.4 % (ref 34.0–45.0)
Hgb: 12.8 gm/dL (ref 11.2–15.7)
Lymphocytes: 22 %
MCH: 30.2 pg (ref 26.0–32.0)
MCHC: 35.2 g/dL (ref 32.0–36.0)
MCV: 85.8 um3 (ref 79.0–95.0)
MPV: 11.4 fL (ref 9.4–12.4)
Monocytes: 8 %
Plt Count: 239 10*3/uL (ref 140–370)
RBC: 4.24 10*6/uL (ref 3.90–5.20)
RDW: 14.2 % — ABNORMAL HIGH (ref 12.0–14.0)
Segs: 69 %
WBC: 8.9 10*3/uL (ref 4.0–10.0)

## 2016-07-17 NOTE — Patient Instructions (Signed)
One in every four patients receive a Patient Satisfaction Survey asking you to evaluate your care. Please respond and mail it back. Your opinion matters!      I hope we met your medical and personal needs today during your visit with [place]. Please do not leave today until we have met and exceeded your expectations. The  nurses of [facility]  value you as a patient greatly! If any questions or concerns arise, or you need to change an appointment, please contact us @ [phone] or [phone].      FETAL MOVEMENT COUNTING INSTRUCTIONS  - Fetal movement counting reduces the risk of stillbirth.  - Mothers who do fetal movement counting using the Fetal Movement Record have a lower risk of stillbirth.    1.  Sit somewhere quietly where you can pay attention to your baby's fetal movement.  2.  Record the first fetal movement and write the time on the Fetal Movement Record.  3.  Count 10 moves and stop.  Record the time on the Fetal Movement Record.    4.  This time must be less than 1 hour or call Labor and Delivery [phone]) and they will instruct you what to do.    5. You can choose times of the day when the baby is most active to do the fetal movement counts but you must get 10 movements in less than an hour at least one time each day.  6.  Do not go to bed without doing a fetal movement count sometime during the day - if you have not been able to get 10 movements in less than an hour during the day, do not go to bed.  Call Labor and Delivery and report this to them.  They will instruct you what to do.  7.  At any time you sense a significant decrease in the baby's movement, you can do a fetal movement count and if you fail to get 10 kicks in an hour, you can call Labor and Delivery for instructions.  8.  If the Movement Time for 10 movements occurs in less than 60 minutes, you can be reassured that your baby is doing well.      FLUID INSTRUCTIONS    Drink at least 8 glasses of water every day.     Rest on  your side several times a day if possible to maximize placental blood flow.    Notify doctor or Labor and Delivery for decreased fetal movement.     PREECLAMPSIA INSTRUCTIONS    Watch for symptoms of increasing high blood pressure - headache not relieved after taking Tylenol, blurry or spotty vision, or severe abdominal pain.    Notify your doctor or Labor and Delivery [phone]) if you have any of these symptoms.     LABOR INSTRUCTIONS:    Call your doctor or call or go to Labor and Delivery [phone]) if you are having any of the following:    1.  Leaking fluid that is not urine or your usual vaginal discharge.  2.  Vaginal bleeding.  3.  Contractions that are uncomfortable or painful occuring every 5 minutes for 1 hour.  4.  Concern that your baby is not moving normally, or taking longer than 1 hour to get 10  fetal movement counts.  5.  Headache not relieved 2 hours after taking Tylenol, eating something and drinking something with caffeine. Call if you experience blurry or spotty vision, or severe abdominal pain.

## 2016-07-17 NOTE — Procedures (Signed)
ANTENATAL BIOPHYSICAL TESTING   Demographics:  Date: July 17, 2016   Patient Name: Theresa Pollard   Medical Record #: [id number]   DOB: [birth year]  Age: 36 year old  Sex: female  GA: 6341w1d   Obstetric History    G5   P4   T4   P0   A0   L4     SAB0   TAB0   Ectopic0   Multiple0   Live Births4    Obstetric Comments   Reportedly normal Pap smear in 2017. No record available.    No history of abnormal paps.    No GC/CT.    Irregular menses. Firm LMP.    GBS with prior pregnancies: no     BP 114/73 (BP Location: Right arm, BP Patient Position: Sitting, BP cuff size: Regular)   Pulse 79   LMP 11/21/2015 (Exact Date)    Clinic Location:  [facility]  [facility]  [address]  [place] [id number]  Requested by:      Phoebe Perchosenblum, Elizabeth A    Encounter Diagnoses   Name Primary?    Zika virus exposure affecting pregnancy Yes    Multigravida, antepartum     Abnormal maternal glucose tolerance, antepartum      Fetal Kick Counts: done - > 10 per hour  Tobacco History:   History   Smoking Status    Former Smoker   Smokeless Tobacco    Never Used     Medications:   Current Outpatient Prescriptions   Medication Sig    glucose blood test strip 1 strip by Other route 5 times daily.    insulin aspart (NOVOLOG) 100 UNIT/ML injection Inject 14 units with breakfast, 6 units with lunch and 24 units with dinner    insulin NPH (HUMULIN N) 100 UNIT/ML injection Inject 14 units before breakfast, 20 units after 10 PM    Insulin Syringe-Needle U-100 (INS SYR UL THIN SHORT .5CC/30G) 30G X 5/16" 0.5 ML MISC Inject 1 Syringe under the skin 3 times daily.    lancets 1 Lancet by Other route 5 times daily.    prenatal vitamin (PRENATAL VITAMIN W/ IRON W/ FOLIC ACID) tablet Take 1 tablet by mouth daily.     No current facility-administered medications for this visit.        Amniotic Fluid Index        2.79         1.89         5.58         2.24     Total: 12.5  Done by Ronnell Guadalajaraarol Caslow  RN    ULTRASOUND DOCUMENTATION  Fetus: single  Presentation: vertex  Placenta: anterior  Fetal cardiac activity noted by mother: yes    Nonstress Test: Reactive: Accels > or =  to 15 BPM x 15 secs x 2  Baseline: 145  Variability: Moderate 6 - 25 bpm  Contractions: none  Time on: 0939     Time Off: 1003    Handouts given: AVS  Patient Education: NST, AFI, FKC, PIH, LABOR and INCREASE FLUID INTAKE    To Labor and Delivery for further evaluation:no    Plan: Repeat NST/AFI:2x per week    Kennyth LoseAmber Demetres Prochnow, RN  Patient Education    Theresa Pollard is a 36 year old female whose chief complaint is Antenatal Testing    Vitals:    07/17/16 1006   BP: 114/73  BP Location: Right arm   BP Patient Position: Sitting   BP cuff size: Regular   Pulse: 79     Pain Score    There is no height or weight on file to calculate BSA.  There is no height or weight on file to calculate BMI.    Patient Education  Identified learning needs: Diagnostic, Tests, and Procedures  Learner: Patient  Barriers to learning: No Barriers  Readiness to learn: Acceptance  Method: Explanation and handout  Treatment education given: Yes  Fall prevention education given: No  Pain education given: No  Response: Verbalizes understanding

## 2016-07-17 NOTE — Procedures (Signed)
The NST is reactive without decelerations.   The AFI is adequate.     Recommendations:   Retest in 3-4 days    Read and interpreted by: Jarryd Gratz, MD

## 2016-07-18 LAB — ZIKA VIRUS BY PCR, URINE: Zika Virus by PCR, Urine: NOT DETECTED

## 2016-07-21 ENCOUNTER — Other Ambulatory Visit (HOSPITAL_BASED_OUTPATIENT_CLINIC_OR_DEPARTMENT_OTHER): Payer: Medicaid Other

## 2016-07-21 ENCOUNTER — Ambulatory Visit: Payer: Medicaid Other | Attending: Obstetrics & Gynecology

## 2016-07-21 DIAGNOSIS — O24414 Gestational diabetes mellitus in pregnancy, insulin controlled: Principal | ICD-10-CM | POA: Insufficient documentation

## 2016-07-21 DIAGNOSIS — O0993 Supervision of high risk pregnancy, unspecified, third trimester: Secondary | ICD-10-CM | POA: Insufficient documentation

## 2016-07-21 DIAGNOSIS — O09899 Supervision of other high risk pregnancies, unspecified trimester: Secondary | ICD-10-CM | POA: Insufficient documentation

## 2016-07-21 DIAGNOSIS — Z3480 Encounter for supervision of other normal pregnancy, unspecified trimester: Secondary | ICD-10-CM | POA: Insufficient documentation

## 2016-07-21 DIAGNOSIS — O09523 Supervision of elderly multigravida, third trimester: Secondary | ICD-10-CM | POA: Insufficient documentation

## 2016-07-21 DIAGNOSIS — O99810 Abnormal glucose complicating pregnancy: Secondary | ICD-10-CM | POA: Insufficient documentation

## 2016-07-21 NOTE — Procedures (Signed)
The NST is reactive without decelerations.   The AFI is adequate.     Recommendations:   Retest in 3-4 days    Read and interpreted by: Peighton Mehra W Izea Livolsi, MD

## 2016-07-21 NOTE — Procedures (Signed)
ANTENATAL BIOPHYSICAL TESTING   Demographics:  Date: July 21, 2016   Patient Name: Theresa Pollard   Medical Record #: [id number]   DOB: [birth year]  Age: 36 year old  Sex: female  GA: 840w5d   Obstetric History    G5   P4   T4   P0   A0   L4     SAB0   TAB0   Ectopic0   Multiple0   Live Births4    Obstetric Comments   Reportedly normal Pap smear in 2017. No record available.    No history of abnormal paps.    No GC/CT.    Irregular menses. Firm LMP.    GBS with prior pregnancies: no     BP 107/65 (BP Location: Left arm, BP Patient Position: Sitting, BP cuff size: Regular)   Pulse 80   LMP 11/21/2015 (Exact Date)    [facility]  [facility]  [facility]  [address]  [place] [phone]  Requested by:      Amaryllis Dykeamos, Gladys Alexandra    Encounter Diagnoses   Name Primary?    Insulin controlled gestational diabetes mellitus (GDM) in second trimester     Supervision of high risk pregnancy in third trimester     Multigravida, antepartum     Abnormal maternal glucose tolerance, antepartum     Supervision of other high risk pregnancy, antepartum      Fetal Kick Counts: done - > 10 per hour  Tobacco History:   History   Smoking Status    Former Smoker   Smokeless Tobacco    Never Used     Medications:   Current Outpatient Prescriptions   Medication Sig    glucose blood test strip 1 strip by Other route 5 times daily.    insulin aspart (NOVOLOG) 100 UNIT/ML injection Inject 14 units with breakfast, 6 units with lunch and 24 units with dinner    insulin NPH (HUMULIN N) 100 UNIT/ML injection Inject 14 units before breakfast, 20 units after 10 PM    Insulin Syringe-Needle U-100 (INS SYR UL THIN SHORT .5CC/30G) 30G X 5/16" 0.5 ML MISC Inject 1 Syringe under the skin 3 times daily.    lancets 1 Lancet by Other route 5 times daily.    prenatal vitamin (PRENATAL VITAMIN W/ IRON W/ FOLIC ACID) tablet Take 1 tablet by mouth daily.     No current  facility-administered medications for this visit.        Amniotic Fluid Index        4.12         00         2.08         2.34     Total: 8.54  Done by Rhianna kogut RN        ULTRASOUND DOCUMENTATION  Fetus: single  Presentation: vertex  Placenta: posterior  Fetal cardiac activity noted by mother: yes    Nonstress Test: Reactive: Accels > or =  to 15 BPM x 15 secs x 2  Baseline: 1400  Variability: Moderate 6 - 25 bpm  Contractions: none  Time on: 1016     Time Off: 1037    Handouts given: None Given  Patient Education: NST, AFI, LABOR and INCREASE FLUID INTAKE    To Labor and Delivery for further evaluation:no    Plan: Repeat NST/AFI:2x per week    Kennyth LoseAmber Sejal Cofield, RN

## 2016-07-23 ENCOUNTER — Other Ambulatory Visit (HOSPITAL_BASED_OUTPATIENT_CLINIC_OR_DEPARTMENT_OTHER): Payer: Self-pay

## 2016-07-23 DIAGNOSIS — O24414 Gestational diabetes mellitus in pregnancy, insulin controlled: Secondary | ICD-10-CM

## 2016-07-23 DIAGNOSIS — O24410 Gestational diabetes mellitus in pregnancy, diet controlled: Principal | ICD-10-CM

## 2016-07-23 MED ORDER — INSULIN ISOPHANE HUMAN 100 UNIT/ML SC SUSP
SUBCUTANEOUS | 2 refills | Status: DC
Start: 2016-07-23 — End: 2016-08-20

## 2016-07-23 MED ORDER — INSULIN ASPART 100 UNIT/ML SC SOLN
SUBCUTANEOUS | 2 refills | Status: DC
Start: 2016-07-23 — End: 2016-08-20

## 2016-07-23 MED ORDER — INSULIN SYRINGE-NEEDLE U-100 30G X 5/16" 0.5 ML MISC
2 refills | Status: DC
Start: 2016-07-23 — End: 2016-08-20

## 2016-07-23 NOTE — Telephone Encounter (Signed)
Pt needs updated script of meds with current doses. New orders in place pt aware.

## 2016-07-24 ENCOUNTER — Ambulatory Visit: Payer: Medicaid Other | Attending: Obstetrics & Gynecology

## 2016-07-24 ENCOUNTER — Telehealth (HOSPITAL_BASED_OUTPATIENT_CLINIC_OR_DEPARTMENT_OTHER): Payer: Self-pay

## 2016-07-24 DIAGNOSIS — Z20828 Contact with and (suspected) exposure to other viral communicable diseases: Secondary | ICD-10-CM | POA: Insufficient documentation

## 2016-07-24 DIAGNOSIS — Z3480 Encounter for supervision of other normal pregnancy, unspecified trimester: Principal | ICD-10-CM | POA: Insufficient documentation

## 2016-07-24 DIAGNOSIS — O09899 Supervision of other high risk pregnancies, unspecified trimester: Secondary | ICD-10-CM | POA: Insufficient documentation

## 2016-07-24 DIAGNOSIS — Z20821 Contact with and (suspected) exposure to Zika virus: Secondary | ICD-10-CM

## 2016-07-24 DIAGNOSIS — O09523 Supervision of elderly multigravida, third trimester: Secondary | ICD-10-CM | POA: Insufficient documentation

## 2016-07-24 DIAGNOSIS — O99810 Abnormal glucose complicating pregnancy: Secondary | ICD-10-CM | POA: Insufficient documentation

## 2016-07-24 NOTE — Telephone Encounter (Signed)
Opened in error

## 2016-07-24 NOTE — Procedures (Signed)
ANTENATAL BIOPHYSICAL TESTING   Demographics:  Date: July 24, 2016   Patient Name: Theresa Pollard   Medical Record #: [id number]   DOB: [birth year]  Age: 36 year old  Sex: female  GA: 4860w1d   Obstetric History    G5   P4   T4   P0   A0   L4     SAB0   TAB0   Ectopic0   Multiple0   Live Births4    Obstetric Comments   Reportedly normal Pap smear in 2017. No record available.    No history of abnormal paps.    No GC/CT.    Irregular menses. Firm LMP.    GBS with prior pregnancies: no     BP 113/63 (BP Patient Position: Sitting, BP cuff size: Regular)   Pulse 86   LMP 11/21/2015 (Exact Date)    [facility]:  [facility]  [facility]  [address]  [place] [phone]  Requested by:      Phoebe Perchosenblum, Elizabeth A    Encounter Diagnoses   Name Primary?    Multigravida, antepartum     Abnormal maternal glucose tolerance, antepartum     Supervision of other high risk pregnancy, antepartum      Fetal Kick Counts: done - > 10 per hour  Tobacco History:   History   Smoking Status    Former Smoker   Smokeless Tobacco    Never Used     Medications:   Current Outpatient Prescriptions   Medication Sig    glucose blood test strip 1 strip by Other route 5 times daily.    insulin aspart (NOVOLOG) 100 UNIT/ML injection Inject 16 units with breakfast, 12 units with lunch and 46 units with dinner    insulin NPH (HUMULIN N) 100 UNIT/ML injection Inject 18 units before breakfast, 50 units after 10 PM    Insulin Syringe-Needle U-100 (INS SYR UL THIN SHORT .5CC/30G) 30G X 5/16" 0.5 ML MISC As needed 4-6 times daily    lancets 1 Lancet by Other route 5 times daily.    prenatal vitamin (PRENATAL VITAMIN W/ IRON W/ FOLIC ACID) tablet Take 1 tablet by mouth daily.     No current facility-administered medications for this visit.        Amniotic Fluid Index        2.66         2.92         1.66         3.25     Total: 10.49    2.66 x 2.44 cm fluid pocket measured.    ULTRASOUND  DOCUMENTATION  Fetus: single  Presentation: vertex  Placenta: fundal  Fetal cardiac activity noted by mother: yes    Nonstress Test: Reactive: Accels > or =  to 15 BPM x 15 secs x 2  Baseline: 135  Variability: Moderate 6 - 25 bpm  Contractions: 0  Time on: 0940     Time Off: 1026    Handouts given: None Given  Patient Education: NST, AFI, FKC, PIH and LABOR    To Labor and Delivery for further evaluation:no      Plan: Repeat NST/AFI:2x per week    Laureen Abrahamserina Kapil Petropoulos, RN, RN

## 2016-07-24 NOTE — Progress Notes (Signed)
FHT: 130, mod var, +accels, no decels  The NST is reactive.  The AFI is adequate based on 4 quadrant testing and single quadrant evaluation criteria.    Recommendations:   Repeat testing in 3-4 days.    Cephas Revard, MD

## 2016-07-25 ENCOUNTER — Telehealth (HOSPITAL_BASED_OUTPATIENT_CLINIC_OR_DEPARTMENT_OTHER): Payer: Self-pay

## 2016-07-25 NOTE — Telephone Encounter (Signed)
[facility] sent request for PA for pt Novolog insulin.  PA completed at covermymeds.  Pt is out of insulin.  Asked if pharmacy could give her some to get through.  Pharmacy denies.  Question why rx is not covered at this point (pt started insulin 03/24/2016), pharmacy could not answer.  Tried running humalog vs novolog.  Pharmacist Arna Medici([place]) said it was covered for 1 vial.  Reviewed that 1 month supply is 3 vials.  Pharmacist states she will get 3 ready for her and will be ready in 2-3 hours.  Pt aware.

## 2016-07-28 ENCOUNTER — Other Ambulatory Visit (HOSPITAL_BASED_OUTPATIENT_CLINIC_OR_DEPARTMENT_OTHER): Payer: Self-pay | Admitting: Gynecology

## 2016-07-28 ENCOUNTER — Ambulatory Visit: Payer: Medicaid Other | Attending: Obstetrics & Gynecology

## 2016-07-28 ENCOUNTER — Telehealth (HOSPITAL_BASED_OUTPATIENT_CLINIC_OR_DEPARTMENT_OTHER): Payer: Self-pay

## 2016-07-28 ENCOUNTER — Telehealth (HOSPITAL_BASED_OUTPATIENT_CLINIC_OR_DEPARTMENT_OTHER): Payer: Self-pay | Admitting: Gynecology

## 2016-07-28 DIAGNOSIS — O09899 Supervision of other high risk pregnancies, unspecified trimester: Secondary | ICD-10-CM | POA: Insufficient documentation

## 2016-07-28 DIAGNOSIS — O99810 Abnormal glucose complicating pregnancy: Secondary | ICD-10-CM | POA: Insufficient documentation

## 2016-07-28 DIAGNOSIS — O09523 Supervision of elderly multigravida, third trimester: Secondary | ICD-10-CM | POA: Insufficient documentation

## 2016-07-28 DIAGNOSIS — Z3480 Encounter for supervision of other normal pregnancy, unspecified trimester: Secondary | ICD-10-CM | POA: Insufficient documentation

## 2016-07-28 DIAGNOSIS — O0993 Supervision of high risk pregnancy, unspecified, third trimester: Secondary | ICD-10-CM | POA: Insufficient documentation

## 2016-07-28 DIAGNOSIS — O24414 Gestational diabetes mellitus in pregnancy, insulin controlled: Principal | ICD-10-CM | POA: Insufficient documentation

## 2016-07-28 MED ORDER — METFORMIN/PLACEBO IRB 16-0660 500 MG
0 refills | Status: DC
Start: 2016-07-28 — End: 2017-06-29

## 2016-07-28 NOTE — Procedures (Signed)
ANTENATAL BIOPHYSICAL TESTING   Demographics:  Date: July 28, 2016   Patient Name: Theresa Pollard   Medical Record #: [id number]   DOB: 1980/08/10  Age: 36 year old  Sex: female  GA: 2266w5d   Obstetric History    G5   P4   T4   P0   A0   L4     SAB0   TAB0   Ectopic0   Multiple0   Live Births4    Obstetric Comments   Reportedly normal Pap smear in 2017. No record available.    No history of abnormal paps.    No GC/CT.    Irregular menses. Firm LMP.    GBS with prior pregnancies: no     BP 115/72 (BP Location: Left arm, BP cuff size: Large)   Pulse 87   LMP 11/21/2015 (Exact Date)    [facility]:  [facility]  [facility]  [address]  [place] [phone]  Requested by:      Phoebe Perchosenblum, Elizabeth A    Encounter Diagnoses   Name Primary?    Insulin controlled gestational diabetes mellitus (GDM) in second trimester     Supervision of high risk pregnancy in third trimester     Multigravida, antepartum     Abnormal maternal glucose tolerance, antepartum     Supervision of other high risk pregnancy, antepartum      Fetal Kick Counts: done - > 10 per hour  Tobacco History:   History   Smoking Status    Former Smoker   Smokeless Tobacco    Never Used     Medications:   Current Outpatient Prescriptions   Medication Sig    glucose blood test strip 1 strip by Other route 5 times daily.    insulin aspart (NOVOLOG) 100 UNIT/ML injection Inject 16 units with breakfast, 12 units with lunch and 46 units with dinner    insulin NPH (HUMULIN N) 100 UNIT/ML injection Inject 18 units before breakfast, 50 units after 10 PM    Insulin Syringe-Needle U-100 (INS SYR UL THIN SHORT .5CC/30G) 30G X 5/16" 0.5 ML MISC As needed 4-6 times daily    lancets 1 Lancet by Other route 5 times daily.    prenatal vitamin (PRENATAL VITAMIN W/ IRON W/ FOLIC ACID) tablet Take 1 tablet by mouth daily.     No current facility-administered medications for this visit.        Amniotic Fluid  Index        3.31         2.05         4.55         1.2     Total: 11.11  US by Kym Groom  Blaire Hodsdon, RN    Amniotic Fluid Index         3.6         1.43       2.86         2.08     Total: 9.97    US by Lambert Keto Oliver, RN    ULTRASOUND DOCUMENTATION  Fetus: single  Presentation: vertex  Placenta: anterior  Fetal cardiac activity noted by mother: yes    Nonstress Test: Reactive: Accels > or =  to 15 BPM x 15 secs x 2  Baseline: 150  Variability: Moderate 6 - 25 bpm  Contractions: 0  Time on: 1027     Time Off: 1049  Handouts given: None Given  Patient Education: NST, AFI and FKC    To Labor and Delivery for further evaluation:no  Plan: Repeat NST/AFI:2x per week    Elon Spanneratyana Ita Teejay Meader, RN, RN

## 2016-07-28 NOTE — Telephone Encounter (Signed)
MOMPOD clinical trial . As per Dr.Ramos; April Rx refilled. Study Visit completed today. IP tolerated well, dosage remains the same; 2 caps BID. Pt advised to contact us with any questions or concerns. Pt verbalized understanding.

## 2016-07-28 NOTE — Telephone Encounter (Signed)
Theresa Pollard is a 36 year old female G5P4004 at gestational age 7257w5d Estimated Date of Delivery: 08/27/16    Pt of Diabob  Diabetes type:GDMa2 dx at 15 weeks via 1-hr GTT 229 mg/dl, U9WA1c 6.1 on 11/91/4711/20/17  Wt: 186 lbs    Chief Complaint   Patient presents with    Diabetes     Blood glucose review     Times of Day Glucose Tested & Results    Mar 20 21 22 23 24 25 26  Med changes 06/30/16 Med changes 07/14/16 Med changes 07/28/16   Fasting @0330  91 95 111 104   106 NPH 18 units, Novolog 16 units, MOMPOD 2 tablets with breakfast No change No change   After Bkfast 121 119 120 101 95 93       After Lunch 132 129 141 120 119 141  Novolog 10 units with lunch Novolog 12 units with lunch Novolog 14 units with lunch   After Dinner 154 200 165 208 156 177  Novolog 40 units MOMPOD 2 tablets with dinner Novolog 46 units  MOMPOD 2 tablets with dinner Novolog 52 units (split dose)  MOMPOD 2 tablets with dinner    Bedtime        NPH 44 units, At 10 PM or later NPH 50 units at 10 PM or later (split dose) NPH 56 units at 10 PM or later (split dose)   Early AM   (0200-0300)               Assessment / Plan:    1. Diabetes: Elevated FBG, and post lunch and dinner. Pt denies s/s of hypoglycemia this week. Insulin changes made per MD order.     2. Routine OB/Fetus: Diabob 07/31/16, sono @ 36 weeks 08/08/16, NST twice a week, FM WNL    3. Education discussed:   Blood glucose / urine monitoring:  blood glucose goals, testing frequency and how/when to report values  Fetal Surveillance: ultrasound and fetal movement   Medication: dose / administration, injection site / rotation, record keeping and adjustments  Hypoglycemia: signs / symptoms and treatment / prevention    Plan of care discussed with patient and patient verbalized understanding: No called pt unable to leave message sent email with POC.

## 2016-07-28 NOTE — Procedures (Signed)
The NST is reactive without decelerations.   The AFI is adequate.     Recommendations:   Retest in 3-4 days    Read and interpreted by: Kerri-Anne Haeberle, MD

## 2016-07-30 ENCOUNTER — Ambulatory Visit (HOSPITAL_BASED_OUTPATIENT_CLINIC_OR_DEPARTMENT_OTHER): Payer: Medicaid Other

## 2016-07-30 NOTE — Telephone Encounter (Signed)
Glucose values and medications reviewed. Medication changes made per my order.    [place] [id number]  Maternal-Fetal Medicine Fellow  Pager [phone]

## 2016-07-31 ENCOUNTER — Ambulatory Visit (HOSPITAL_BASED_OUTPATIENT_CLINIC_OR_DEPARTMENT_OTHER): Payer: Medicaid Other | Admitting: Obstetrics & Gynecology

## 2016-07-31 ENCOUNTER — Ambulatory Visit: Payer: Medicaid Other | Attending: Gynecology

## 2016-07-31 ENCOUNTER — Encounter (HOSPITAL_BASED_OUTPATIENT_CLINIC_OR_DEPARTMENT_OTHER): Payer: Self-pay | Admitting: Obstetrics & Gynecology

## 2016-07-31 ENCOUNTER — Ambulatory Visit (HOSPITAL_BASED_OUTPATIENT_CLINIC_OR_DEPARTMENT_OTHER): Payer: Medicaid Other

## 2016-07-31 ENCOUNTER — Other Ambulatory Visit (HOSPITAL_BASED_OUTPATIENT_CLINIC_OR_DEPARTMENT_OTHER): Payer: Medicaid Other

## 2016-07-31 DIAGNOSIS — O350XX0 Maternal care for (suspected) central nervous system malformation in fetus, not applicable or unspecified: Secondary | ICD-10-CM | POA: Insufficient documentation

## 2016-07-31 DIAGNOSIS — Z20828 Contact with and (suspected) exposure to other viral communicable diseases: Secondary | ICD-10-CM

## 2016-07-31 DIAGNOSIS — Z20821 Contact with and (suspected) exposure to Zika virus: Secondary | ICD-10-CM

## 2016-07-31 DIAGNOSIS — O09893 Supervision of other high risk pregnancies, third trimester: Secondary | ICD-10-CM

## 2016-07-31 DIAGNOSIS — O99213 Obesity complicating pregnancy, third trimester: Secondary | ICD-10-CM

## 2016-07-31 DIAGNOSIS — Z302 Encounter for sterilization: Secondary | ICD-10-CM

## 2016-07-31 DIAGNOSIS — Z3A36 36 weeks gestation of pregnancy: Secondary | ICD-10-CM | POA: Insufficient documentation

## 2016-07-31 DIAGNOSIS — O288 Other abnormal findings on antenatal screening of mother: Secondary | ICD-10-CM | POA: Insufficient documentation

## 2016-07-31 DIAGNOSIS — O09523 Supervision of elderly multigravida, third trimester: Secondary | ICD-10-CM

## 2016-07-31 DIAGNOSIS — O09823 Supervision of pregnancy with history of in utero procedure during previous pregnancy, third trimester: Secondary | ICD-10-CM

## 2016-07-31 DIAGNOSIS — Z6791 Unspecified blood type, Rh negative: Secondary | ICD-10-CM

## 2016-07-31 DIAGNOSIS — O0993 Supervision of high risk pregnancy, unspecified, third trimester: Secondary | ICD-10-CM

## 2016-07-31 DIAGNOSIS — O24414 Gestational diabetes mellitus in pregnancy, insulin controlled: Principal | ICD-10-CM

## 2016-07-31 DIAGNOSIS — O9989 Other specified diseases and conditions complicating pregnancy, childbirth and the puerperium: Secondary | ICD-10-CM | POA: Insufficient documentation

## 2016-07-31 DIAGNOSIS — O26899 Other specified pregnancy related conditions, unspecified trimester: Secondary | ICD-10-CM

## 2016-07-31 DIAGNOSIS — Z789 Other specified health status: Secondary | ICD-10-CM

## 2016-07-31 DIAGNOSIS — O09899 Supervision of other high risk pregnancies, unspecified trimester: Secondary | ICD-10-CM | POA: Insufficient documentation

## 2016-07-31 DIAGNOSIS — O99810 Abnormal glucose complicating pregnancy: Secondary | ICD-10-CM | POA: Insufficient documentation

## 2016-07-31 DIAGNOSIS — Z3480 Encounter for supervision of other normal pregnancy, unspecified trimester: Principal | ICD-10-CM | POA: Insufficient documentation

## 2016-07-31 LAB — GLYCOSYLATED HGB(A1C), BLOOD: Glyco Hgb (A1C): 6.2 % — ABNORMAL HIGH (ref 4.8–5.8)

## 2016-07-31 LAB — TSH, BLOOD: TSH: 1.51 u[IU]/mL (ref 0.27–4.20)

## 2016-07-31 NOTE — Progress Notes (Signed)
Non stress test is reactive.    Amniotic fluid index is borderline ranging between 5.3 to 6.6 cm.  There is a single deepest pocket of > 2 cm.    Recommend  Repeat testing in 3-4 days.    Corky Soxhomas Francis Conya Ellinwood  Signature Derived From Controlled [employer] Password, July 31, 2016, 11:39 AM

## 2016-07-31 NOTE — Procedures (Signed)
ANTENATAL BIOPHYSICAL TESTING   Demographics:  Date: July 31, 2016   Patient Name: Theresa Pollard   Medical Record #: [id number]   DOB: Nov 22, 1980  Age: 36 year old  Sex: female  GA: [redacted]w[redacted]d   Obstetric History    G5   P4   T4   P0   A0   L4     SAB0   TAB0   Ectopic0   Multiple0   Live Births4    Obstetric Comments   Reportedly normal Pap smear in 2017. No record available.    No history of abnormal paps.    No GC/CT.    Irregular menses. Firm LMP.    GBS with prior pregnancies: no     LMP 11/21/2015 (Exact Date)    Clinic Location:  [facility]  [facility]  [address]  [place] [address]  Requested by:      Phoebe Perch    Encounter Diagnoses   Name Primary?    Multigravida, antepartum     Abnormal maternal glucose tolerance, antepartum      Fetal Kick Counts: done - > 10 per hour and reviewed and reinforced  Tobacco History:   History   Smoking Status    Former Smoker   Smokeless Tobacco    Never Used     Medications:   Current Outpatient Prescriptions   Medication Sig    glucose blood test strip 1 strip by Other route 5 times daily.    insulin aspart (NOVOLOG) 100 UNIT/ML injection Inject 16 units with breakfast, 12 units with lunch and 46 units with dinner    insulin NPH (HUMULIN N) 100 UNIT/ML injection Inject 18 units before breakfast, 50 units after 10 PM    Insulin Syringe-Needle U-100 (INS SYR UL THIN SHORT .5CC/30G) 30G X 5/16" 0.5 ML MISC As needed 4-6 times daily    lancets 1 Lancet by Other route 5 times daily.    prenatal vitamin (PRENATAL VITAMIN W/ IRON W/ FOLIC ACID) tablet Take 1 tablet by mouth daily.     No current facility-administered medications for this visit.        Amniotic Fluid Index - by RN R. Abbagayle Zaragoza        2.52         0.00         2.78         0.00     Total: 5.30    Amniotic Fluid Index - by RN R. Mary-Anne Polizzi        2.71         1.03       1.56         0.00     Total: 5.30    Amniotic Fluid Index - by RN R.  Sebastyan Snodgrass        2.61         1.26       2.75         0.00     Total: 6.62    Single deepest pocket - 2.38x2.95  Average of three AFIs - 5.7        Amniotic Fluid Index - by Dr. Bishop Limbo        2.55         2.88       0.00         0.00     Total: 5.43  Single deepest pocket - 2.95x3.21      ULTRASOUND DOCUMENTATION  Fetus: single  Presentation: vertex  Placenta: posterior  Fetal cardiac activity noted by mother: yes    Nonstress Test: Reactive: Accels > or =  to 15 BPM x 15 secs x 2  Baseline: 145 for 20 mins, 135 for 14 mins  Variability: Moderate 6 - 25 bpm  Contractions: 0  Time on: 0939     Time Off: 1014    Handouts given: None Given - provided at clinic visit  Patient Education: NST, AFI, FKC, PIH and LABOR and increase fluid intake    Assessment: AFI 5.3, 5.3, 6.6. Average of three AFIs - 5.7. Single deepest pocket - 2.38x2.95. Denies LOF. Discussed pt with Dr. Bishop Limbo. Kelly, who came to speak to pt while pt was in US room. He repeated AFI and got 5.43 with a 2.95x3.21 pocket. He recommended a repeat growth scan and called to arrange for pt to have [facility]. Pt sent to FAU from clinic visit.     Plan: Repeat NST/AFI:2x per week    Johnell Landowski, RN, RNC-OB

## 2016-07-31 NOTE — Progress Notes (Signed)
Theresa Pollard is a 36 year old female G5P4004 at gestational age 7121w1d Estimated Date of Delivery: 08/27/16    Pt of Diabob  Diabetes type:GDMa2 dx at 15 weeks via 1-hr GTT 229 mg/dl, W0JA1c 6.1 on 81/19/1411/20/17  Wt: 189 lbs    Chief Complaint   Patient presents with    Routine Prenatal Visit     Times of Day Glucose Tested & Results    Mar 20 21 22 23 24 25 26  Med changes 07/14/16 Med changes 07/28/16 Med changes 07/31/16   Fasting @0330  91 95 111 104   106 NPH 18 units, Novolog 16 units, MOMPOD 2 tablets with breakfast No change No change   After Bkfast 121 119 120 101 95 93       After Lunch 132 129 141 120 119 141  Novolog 12 units with lunch Novolog 14 units with lunch No change   After Dinner 154 200 165 208 156 177  Novolog 46 units  MOMPOD 2 tablets with dinner Novolog 52 units (split dose)  MOMPOD 2 tablets with dinner  Novolog 56 units (split dose)  MOMPOD 2 tablets with dinner    Bedtime        NPH 50 units at 10 PM or later (split dose) NPH 56 units at 10 PM or later (split dose) NPH 66 units at 10 PM or later (split dose)   Early AM   (0200-0300)               Assessment / Plan:    1. Diabetes: Elevated FBG and dinner values per pt but improving. Pt denies s/s of hypoglycemia this week. Insulin changes made per MD order.     PP: Discussed whether pt is GDM vs Type 2. Pt aware may need meds after deliver if BG elevated would recommend Metformin 500 mg BID if needed. Pt to send BG weekly for review. If sent home without meds pt will still send BG for us to review. If pt doing well PP with BG control would then [facility]     2. Routine OB/Fetus: Diabob weekly, sono @ 36 weeks 08/08/16, NST twice a week, FM WNL    3. Education discussed:   Blood glucose / urine monitoring:  blood glucose goals, testing frequency and how/when to report values  Fetal Surveillance: ultrasound and fetal movement   Medication: dose / administration, injection site / rotation, record keeping and adjustments  Hypoglycemia: signs  / symptoms and treatment / prevention    Plan of care discussed with patient and patient verbalized understanding: Yes    Time spent with patient 15 minutes.

## 2016-07-31 NOTE — Progress Notes (Signed)
OB NOTE    36 year old G5P4004 at 1774w1d    Subjective:  Denies Harrison's, VB, LOF.  Reports +FM.  Denies HA, vision changes, RUQ/epigatric pain.    Objective:  BP 107/65 (BP Location: Right arm, BP Patient Position: Sitting, BP cuff size: Regular)   Pulse 92   Temp 97.7 F (36.5 C) (Oral)   Ht 4\' 7"  (1.397 m)   Wt 85.7 kg (189 lb)   LMP 11/21/2015 (Exact Date)   BMI 43.93 kg/m552    A/P: 36 year old G5P4004 at 574w1d    # Prediabetes  - see CDE note for changes, increase nighttime NPH  - HbA1C today  - TSH screening due to medical history, age, BMI  - Growth US today in FAU    # AFI borderline  - Antepartum testing 2x/week    # Obesity (BMI 40)  - recommended weight gain 11 to 20 lbs (5 to 9.0 kg)  - 18 lb weight gain thus far during pregnancy    # Zika virus exposure  - [place] to [place] during [place]  - Zika urine negative on 07/14/16  - Zika serum ordered today    # Dating: By LMP c/w 9wk US, Estimated Date of Delivery: 08/27/16.   # Prenatal: GBS today, request NIPT record from [facility]  # MOD: pending Growth US  # BCM: s/p BTL consent (04/14/16)  # RTC: 1 week    Plan of care discussed with Dr. Tresa EndoKelly, attending physician.    [name], MD [phone]  Maternal-Fetal Medicine Fellow  Pager: [phone]

## 2016-07-31 NOTE — Patient Instructions (Addendum)
Obstetrical Discharge Instructions:    [facility] Phone #: [phone]  Emergency #: [phone]) [phone]  Clinic #: [id number]) [phone]  Ultrasound #: [id number]) [phone]    1. Labs today  2. Ultrasound today  3. Follow-up in 1 week    Please follow the instructions for:    LABOR PRECAUTIONS  1. Contractions every 5 minutes or less.  2. Temperature higher than 100.5 F.  3. Vaginal bleeding  4. Ruptured membranes (bag of water broken).  5. Decreased fetal movement    PREGNANCY INDUCED HYPERTENSION  1. Headache: sudden or severe.  2. Visual problems: blurred or double vision or seeing spots  3. Pain under your ribs, right over your stomach  4. Swelling of your face and hands  5. Swelling of your ankles or feet after 12-hour rest  6. Decrease in the amount of your urine  7. Rapid weight gain: 4 or 5 pounds in one week.    Future Appointments  Date Time Provider Department Center   07/31/2016 11:10 AM Rykin Route, Fay Recordsharlotte L, MD MOS [facility]   08/04/2016 10:15 AM Hcnonstr MOS [facility] MOS   08/07/2016 9:30 AM Hcnonstr MOS [facility]   08/07/2016 10:50 AM Irven BaltimoreAdami, Rebecca R, MD MOS [facility]   08/08/2016 7:45 AM DP FETAL ULTRASOUND 6 D[address]   08/11/2016 10:15 AM Hcnonstr MOS [facility]   08/14/2016 9:30 AM Hcnonstr MOS [facility]   08/21/2016 9:30 AM Hcnonstr MOS [facility] MOS

## 2016-08-01 NOTE — Progress Notes (Signed)
Patient seen.  Notes good FM.  AFI today is borderline, but with a single deepest pocket of > 2 cm.  Arranged to have her growth ultrasound today.  Insulin adjusted.  GBS today.  Needs repeat nst and afi in 4 days.    Corky Sox  Signature Derived From Controlled Access Password, August 01, 2016, 4:04 PM

## 2016-08-03 LAB — GROUP B STREP CULTURE: Strep Group B Culture Result: NO GROWTH

## 2016-08-04 ENCOUNTER — Encounter (HOSPITAL_BASED_OUTPATIENT_CLINIC_OR_DEPARTMENT_OTHER): Payer: Self-pay

## 2016-08-04 ENCOUNTER — Ambulatory Visit: Payer: Medicaid Other | Attending: Obstetrics & Gynecology

## 2016-08-04 VITALS — BP 114/65 | HR 78

## 2016-08-04 DIAGNOSIS — O99810 Abnormal glucose complicating pregnancy: Principal | ICD-10-CM | POA: Insufficient documentation

## 2016-08-04 DIAGNOSIS — R7303 Prediabetes: Secondary | ICD-10-CM | POA: Insufficient documentation

## 2016-08-04 LAB — ZIKA VIRUS IGM AB CAPTURE (MAC), ELISA: Zika Virus IgM Ab Capture (MAC), ELISA: NEGATIVE

## 2016-08-04 NOTE — Procedures (Signed)
ANTENATAL BIOPHYSICAL TESTING   Demographics:  Date: August 04, 2016   Patient Name: Theresa Pollard   Medical Record #: [id number]   DOB: 10-03-1980  Age: 36 year old  Sex: female  GA: [redacted]w[redacted]d   Obstetric History    G5   P4   T4   P0   A0   L4     SAB0   TAB0   Ectopic0   Multiple0   Live Births4    Obstetric Comments   Normal pap 2017.   No history of abnormal paps.    No GC/CT.    Irregular menses. Firm LMP.    GBS with prior pregnancies: no     BP 114/65 (BP Location: Right arm, BP Patient Position: Sitting, BP cuff size: Regular)   Pulse 78   LMP 11/21/2015 (Exact Date)    Clinic Location:  [facility] AMBULATORY CARE CTR  [place] [facility] SERVICES  [address]  [place] [address]  Requested by:      Phoebe Perch    Encounter Diagnoses   Name Primary?    Abnormal maternal glucose tolerance, antepartum Yes    Prediabetes      Fetal Kick Counts: done - > 10 per hour  Tobacco History:   History   Smoking Status    Former Smoker   Smokeless Tobacco    Never Used     Medications:   Current Outpatient Prescriptions   Medication Sig    glucose blood test strip 1 strip by Other route 5 times daily.    insulin aspart (NOVOLOG) 100 UNIT/ML injection Inject 16 units with breakfast, 12 units with lunch and 46 units with dinner    insulin NPH (HUMULIN N) 100 UNIT/ML injection Inject 18 units before breakfast, 50 units after 10 PM    Insulin Syringe-Needle U-100 (INS SYR UL THIN SHORT .5CC/30G) 30G X 5/16" 0.5 ML MISC As needed 4-6 times daily    lancets 1 Lancet by Other route 5 times daily.    prenatal vitamin (PRENATAL VITAMIN W/ IRON W/ FOLIC ACID) tablet Take 1 tablet by mouth daily.     No current facility-administered medications for this visit.        Amniotic Fluid Index        3.18         1.86         2.36         3.38     Total: 10.78      ULTRASOUND DOCUMENTATION  Fetus: single  Presentation: vertex, asynclitic shoulder presentation  Placenta: anterior  Fetal cardiac  activity noted by mother: yes    Nonstress Test: Reactive: Accels > or =  to 15 BPM x 15 secs x 2  Baseline: 130  Variability: Moderate 6 - 25 bpm  Contractions: none  Time on: 1003     Time Off: 1024    Handouts given: None Given  Patient Education: NST, AFI, LABOR and INCREASE FLUID INTAKE    To Labor and Delivery for further evaluation:no    Plan: Repeat NST/AFI:2x per week    Kennyth Lose, RN

## 2016-08-04 NOTE — Procedures (Signed)
The NST is reactive without decelerations.   The AFI is adequate.     Recommendations:   Retest in 3-4 days    Read and interpreted by: Shakyia Bosso W Dewey Viens, MD

## 2016-08-05 LAB — ZIKA VIRUS BY PCR, BLOOD: Zika Virus by PCR, Blood: NOT DETECTED

## 2016-08-07 ENCOUNTER — Encounter (HOSPITAL_BASED_OUTPATIENT_CLINIC_OR_DEPARTMENT_OTHER): Payer: Self-pay | Admitting: Ob/Gyn

## 2016-08-07 ENCOUNTER — Ambulatory Visit (HOSPITAL_BASED_OUTPATIENT_CLINIC_OR_DEPARTMENT_OTHER): Payer: Medicaid Other | Admitting: Ob/Gyn

## 2016-08-07 ENCOUNTER — Ambulatory Visit: Payer: Medicaid Other | Attending: Gynecology

## 2016-08-07 DIAGNOSIS — Z3480 Encounter for supervision of other normal pregnancy, unspecified trimester: Secondary | ICD-10-CM | POA: Insufficient documentation

## 2016-08-07 DIAGNOSIS — Z3A37 37 weeks gestation of pregnancy: Secondary | ICD-10-CM

## 2016-08-07 DIAGNOSIS — R7303 Prediabetes: Principal | ICD-10-CM | POA: Insufficient documentation

## 2016-08-07 DIAGNOSIS — O9989 Other specified diseases and conditions complicating pregnancy, childbirth and the puerperium: Secondary | ICD-10-CM

## 2016-08-07 DIAGNOSIS — O09899 Supervision of other high risk pregnancies, unspecified trimester: Secondary | ICD-10-CM | POA: Insufficient documentation

## 2016-08-07 DIAGNOSIS — O24414 Gestational diabetes mellitus in pregnancy, insulin controlled: Secondary | ICD-10-CM

## 2016-08-07 DIAGNOSIS — O09523 Supervision of elderly multigravida, third trimester: Secondary | ICD-10-CM

## 2016-08-07 NOTE — Progress Notes (Signed)
Non stress test is reactive.    Amniotic fluid index is normal.    Recommend  Repeat testing in 3-4 days.    Corky Sox  Signature Derived From Controlled Access Password, August 07, 2016, 11:21 AM

## 2016-08-07 NOTE — Progress Notes (Signed)
Theresa Pollard is a 36 year old female G5P4004 at gestational age [redacted]w[redacted]d Estimated Date of Delivery: 08/27/16    Pt of Diabob  Diabetes type:GDMa2 dx at 15 weeks via 1-hr GTT 229 mg/dl, Z6X 6.1 on 09/60/45  BP 117/75 (BP Location: Left arm, BP Patient Position: Sitting, BP cuff size: Regular)   Pulse 77   Temp 97.6 F (36.4 C) (Oral)   Resp 16   Ht  (1.397 m)   Wt 86.7 kg (191 lb 3.2 oz)   LMP 11/21/2015 (Exact Date)   SpO2 98%   BMI 44.44 kg/m2    Chief Complaint   Patient presents with    Routine Prenatal Visit     Times of Day Glucose Tested & Results    Mar/Apr 30 31 4/1 2 3 4 5  Med changes 07/28/16 Med changes 07/31/16 Med Changes 08/07/16 PP Regimen if needed   Fasting  78 91 84 107 87 106 109 NPH 18 units, Novolog 16 units, MOMPOD 2 tablets with breakfast No change No change Metformin 500 mg with breakfast   After Bkfast 75 74 132 124 136 120        After Lunch 132 124 90 88 83 79  Novolog 14 units with lunch No change No change none   After Dinner 175 154 132 120 190 218  Novolog 52 units (split dose)  MOMPOD 2 tablets with dinner  Novolog 56 units (split dose)  MOMPOD 2 tablets with dinner  Novolog 62 units (split dose)  MOMPOD 2 tablets with dinner  Metformin 500 mg with dinner   Bedtime        NPH 56 units at 10 PM or later (split dose) NPH 66 units at 10 PM or later (split dose) NPH 72 units at 10 PM or later (split dose) none   Early AM   (0200-0300)                Assessment / Plan:    1. Diabetes: Elevated FBG and dinner values per pt but improving. Pt denies s/s of hypoglycemia this week. Insulin changes made per MD order.     2. Routine OB/Fetus: Diabob weekly, growth sono complete, NST twice a week, FM WNL, IOL at 38.5 wks    3. Education discussed:   Blood glucose / urine monitoring:  blood glucose goals, testing frequency and how/when to report values  Fetal Surveillance: ultrasound and fetal movement   Medication: dose / administration, injection site / rotation, record keeping and  adjustments  Hypoglycemia: signs / symptoms and treatment / prevention    Plan of care discussed with patient and patient verbalized understanding: Yes    Time spent with patient 15 minutes.

## 2016-08-07 NOTE — Progress Notes (Signed)
Patient seen.  Doing well.  Insulin adjusted.  Reviewed ultrasound.    FH 37.  NST and AFI today.    Discussed IOL at 39 weeks.  Patient desires.  Scheduled for 08/20/16 at 0800.  RTC in 1 week.    Corky Sox  Signature Derived From Controlled Access Password, August 07, 2016, 11:11 AM

## 2016-08-07 NOTE — Procedures (Signed)
ANTENATAL BIOPHYSICAL TESTING   Demographics:  Date: August 07, 2016   Patient Name: Theresa Pollard   Medical Record #: [id number]   DOB: [birth year]  Age: 36 year old  Sex: female  GA: [redacted]w[redacted]d   Obstetric History    G5   P4   T4   P0   A0   L4     SAB0   TAB0   Ectopic0   Multiple0   Live Births4    Obstetric Comments   Normal pap 2017.   No history of abnormal paps.    No GC/CT.    Irregular menses. Firm LMP.    GBS with prior pregnancies: no     BP 113/64 (BP Location: Left arm, BP Patient Position: Sitting, BP cuff size: Large)   Pulse 85   LMP 11/21/2015 (Exact Date)    Clinic Location:  [facility] AMBULATORY CARE CTR  [place] [facility] SERVICES  [address]  [place] [address]  Requested by:      Theresa Pollard    Encounter Diagnoses   Name Primary?    Prediabetes     Multigravida, antepartum     Supervision of other high risk pregnancy, antepartum      Fetal Kick Counts: done - > 10 per hour  Tobacco History:   History   Smoking Status    Former Smoker   Smokeless Tobacco    Never Used     Medications:   Current Outpatient Prescriptions   Medication Sig    glucose blood test strip 1 strip by Other route 5 times daily.    insulin aspart (NOVOLOG) 100 UNIT/ML injection Inject 16 units with breakfast, 12 units with lunch and 46 units with dinner    insulin NPH (HUMULIN N) 100 UNIT/ML injection Inject 18 units before breakfast, 50 units after 10 PM    Insulin Syringe-Needle U-100 (INS SYR UL THIN SHORT .5CC/30G) 30G X 5/16" 0.5 ML MISC As needed 4-6 times daily    lancets 1 Lancet by Other route 5 times daily.    prenatal vitamin (PRENATAL VITAMIN W/ IRON W/ FOLIC ACID) tablet Take 1 tablet by mouth daily.     No current facility-administered medications for this visit.        Amniotic Fluid Index        2.5         2.34         2.89         1.36     Total: 9.09        ULTRASOUND DOCUMENTATION  Fetus: single  Presentation: vertex  Placenta: anterior  Fetal cardiac activity  noted by mother: yes        Nonstress Test: Reactive: Accels > or =  to 15 BPM x 15 secs x 2  Baseline: 140  Variability: Moderate 6 - 25 bpm  Contractions: none  Time on: 0954     Time Off: 1022    Handouts given: None Given  Patient Education: NST, AFI and FKC    To Labor and Delivery for further evaluation:no    Assessment: FKC obtained by patient with in 10 minutes    Plan: Repeat NST/AFI:2x per week    Theresa Cookey, RN

## 2016-08-07 NOTE — Patient Instructions (Signed)
Obstetrical Outpatient Discharge Instructions:  Labor & Deliverys Phone #: [phone] - [facility] Phone #: [phone]    Please schedule an appointment to be seen in 1 weeks.     Please follow the instructions for:    LABOR PRECAUTIONS    1. Contractions every 5 minutes or less.  2. Temperature higher than 100.5 F.  3. Vaginal bleeding  4. Ruptured membranes (bag of water broken).  5. Decreased fetal movement        FETAL KICK COUNTS    1. Count the babys movement every night.  2. A movement may be a kick, swish or roll. Do not count hiccups or small flutters.  3. Count babys movements while lying down, preferably on your left side,  preferably after a meal.  4. Mark down the time you feel the baby move for the first time.  5. Mark down the time you feel the tenth fetal movement.  6. You should feel at least 10 fetal movements within one hour.  7. Call Labor and Delivery immediately if:  a. You do not feel 10 movements within one hour  b. It takes longer and longer for your baby to move 10 times  c. You have not felt your baby move all day.            Future Appointments  Date Time Provider Department Center   08/11/2016 10:15 AM Hcnonstr MOS [facility] MOS   08/14/2016 9:30 AM Hcnonstr MOS [facility] MOS   08/21/2016 9:30 AM Hcnonstr MOS [facility] MOS        LABOR INDUCTION INSTRUCTIONS  Your induction of labor is scheduled for 4/18 at 8am   Please call Labor & Delivery at 7am to check availability.  Labor and Delivery phone number is [phone].  You may eat a light meal before coming to the hospital.   Arrive on L&D at 8am or at the time instructed when you call.

## 2016-08-07 NOTE — Progress Notes (Signed)
MFM Fellow Note     36 year old G5P4004 at [redacted]w[redacted]d with GDMA2.     Primarily seen by Dr Tresa Endo.     IOL scheduled 4/18 at 8am   Call at 7am     Instructions [place] [id number]  Maternal Fetal Medicine Fellow  Cell [phone]

## 2016-08-08 ENCOUNTER — Ambulatory Visit (HOSPITAL_BASED_OUTPATIENT_CLINIC_OR_DEPARTMENT_OTHER): Payer: Medicaid Other

## 2016-08-10 ENCOUNTER — Ambulatory Visit
Admission: AD | Admit: 2016-08-10 | Discharge: 2016-08-10 | Disposition: A | Discharge status: Home / Self Care | Payer: Medicaid Other | Attending: Obstetrics & Gynecology | Admitting: Obstetrics & Gynecology

## 2016-08-10 DIAGNOSIS — O479 False labor, unspecified: Secondary | ICD-10-CM

## 2016-08-10 DIAGNOSIS — R7303 Prediabetes: Secondary | ICD-10-CM | POA: Insufficient documentation

## 2016-08-10 LAB — GLUCOSE (POCT): Glucose (POCT): 107 mg/dL — ABNORMAL HIGH (ref 70–99)

## 2016-08-10 NOTE — Discharge Instructions (Signed)
Obstetrical Outpatient Discharge Instructions:    Labor & Deliverys Phone #: [phone]                          Based on the medical evaluation completed by our staff, it has been determined that you are NOT IN NEED OF EMERGENCY OBSTETRICAL SERVICES AT THIS TIME.     If and when any of the symptoms noted below occur, you are advised to go to the hospital closest to your home that provides obstetrical services or to the hospital that you and your health care provider have agreed upon.    Please follow the instructions for:    LABOR PRECAUTIONS    1. Contractions every 5 minutes or less.  2. Temperature higher than 100.5 F.  3. Vaginal bleeding  4. Ruptured membranes (bag of water broken).  5. Decreased fetal movement        FETAL KICK COUNTS    1. Count the babys movement every night.  2. A movement may be a kick, swish or roll. Do not count hiccups or small flutters.  3. Count babys movements while lying down, preferably on your left side,  preferably after a meal.  4. Mark down the time you feel the baby move for the first time.  5. Mark down the time you feel the tenth fetal movement.  6. You should feel at least 10 fetal movements within one hour.  7. Call Labor and Delivery immediately if:  a. You do not feel 10 movements within one hour  b. It takes longer and longer for your baby to move 10 times  c. You have not felt your baby move all day.          PREGNANCY INDUCED HYPERTENSION    1. Headache: sudden or severe.  2. Visual problems: blurred or double vision or seeing spots  3. Pain under your ribs, right over your stomach  4. Swelling of your face and hands  5. Swelling of your ankles or feet after 12-hour rest  6. Decrease in the amount of your urine  7. Rapid weight gain: 4 or 5 pounds in one week.      APPOINTMENT: Future Appointments  Date Time Provider Department Center   08/11/2016 10:15 AM Hcnonstr MOS [facility] MOS   08/14/2016 9:30 AM Hcnonstr MOS [facility] MOS   08/18/2016 12:40 PM Medica, Darletta Moll, MD  MOS [facility] MOS   08/18/2016 1:00 PM Hcnonstr MOS [facility] MOS   10/01/2016 9:40 AM Ngan, Rolm Gala, MD MOS [facility] MOS

## 2016-08-10 NOTE — Progress Notes (Signed)
Labor and Delivery Triage Assessment      History of Present Illness:  Theresa Pollard [id number] is a 36 year old G5P4004 at [redacted]w[redacted]d EGA who presents for a chief complaint of contractions.    Patient started having regular contractions this morning which were q8-10 in frequency, 7/10 pain. As she got closer to the hospital, her contractions spaced out and now she has not felt a contraction for some time. Denies vaginal bleeding, discharge, dysuria, HA, vision changes, and RUQ pain    Leaking of fluid: No  Contractions: Yes   Pre-eclampsia symptoms: No  Bleeding: No  Dysuria: No  Normal fetal movement: yes    Past Medical and Surgical History:  Past Medical History:   Diagnosis Date    Obesity     Prediabetes      Past Surgical History:   Procedure Laterality Date    CARPAL TUNNEL RELEASE      CHOLECYSTECTOMY         Social History:  Social History     Social History    Marital status: Married     Spouse name: N/A    Number of children: N/A    Years of education: N/A     Social History Main Topics    Smoking status: Former Smoker    Smokeless tobacco: Never Used    Alcohol use Not on file    Drug use: Not on file    Sexual activity: Not on file     Social Activities of Daily Living Present    Not on file     Social History Narrative       Family History:  No family history on file.    Allergies:  No Known Allergies    Review of Systems:   A comprehensive review of systems was negative except for: abdominal contractions    Vitals:   08/10/16  0205   BP: 121/81   Pulse: 93   Resp: 18   Temp: 97.1 F (36.2 C)       General appearance: in no apparent distress  Heart: regular rate and rhythm  Lungs:clear to auscultation  Abdomen: Abdomen soft, non-tender. BS normal. No masses, organomegaly  Extremities: 1+ edema  Neuro: no clonus, 1+ DTRs  Pelvic: SVE: fingertip/0/-3    FHR: baseline 130, moderate variability, positive accelerations, no decelerations  Uterine Activity: mildly irritable, no  contractions    Abdominal US: fetus cephalic, placenta posterior, AFI 8.27 cm    Labs:   Results for orders placed or performed during the hospital encounter of 08/10/16   GLUCOSE (POCT)   Result Value Ref Range    Glucose (POCT) 107 (H) 70 - 99 mg/dL       Assessment/Plan:  Theresa Pollard is a 36 year old G5P4004 presents for r/o labor  1. R/o labor  -- patient without contractions upon arrival to triage. SVE fingertip/0/-3. Likely in very early stages of labor. Will discharge home with strict labor precautions given that she has had 4 vaginal deliveries. IOL scheduled for 08/20/16 at 0800  2. Fetal Well-being: NST reactive, AFI adequate, overall reassuring fetal status  3. Dispo: home with strict labor precautions    Discharge instructions and precautions reviewed with patient.    Future Appointments  Date Time Provider Department Center   08/11/2016 10:15 AM Hcnonstr MOS [facility] MOS   08/14/2016 9:30 AM Hcnonstr MOS [facility] MOS   08/18/2016 12:40 PM Medica, Darletta Moll, MD MOS [facility] MOS  08/18/2016 1:00 PM Hcnonstr MOS [facility]S MOS   10/01/2016 9:40 AM Petro Talent, Rolm Gala, MD MOS [facility] MOS       Plan of care discussed with attending, Dr. Barnab[place] [id number]  Resident Physician, PGY-1  Department of Reproductive Medicine  Pager: [phone]

## 2016-08-10 NOTE — Interdisciplinary (Signed)
Pt labor discharge instructions discussed with pt by this rn. Pt verbalizes understanding with no questions. AVS summary reviewed and handed to pt.Pt off floor with no S&S of distress accompanied by 2 young daughters    Future Appointments  Date Time Provider Department Center   08/11/2016 10:15 AM Hcnonstr MOS [facility] MOS   08/14/2016 9:30 AM Hcnonstr MOS [facility] MOS   08/18/2016 12:40 PM Medica, Darletta Moll, MD MOS [facility] MOS   08/18/2016 1:00 PM Hcnonstr MOS [facility] MOS   10/01/2016 9:40 AM Ngan, Rolm Gala, MD MOS [facility] MOS

## 2016-08-10 NOTE — Interdisciplinary (Signed)
Theresa Pollard is a 36 year old G5P4004 at 73w4dpresenting to labor & delivery complaining of strong regular contractions since 2100..Marland KitchenDenies S&S of PET, UTI, LOF, VB. +FM. .Marland Kitchen   Medical / OB History:   Patient Active Problem List   Diagnosis    Prediabetes    Rh negative state in antepartum period    Need for MMR vaccine    Zika virus exposure    AMA (advanced maternal age) multigravida 35+    Patient consented for MOMPOD Study (IRB [phone]    Amniotic fluid index borderline low    Obesity (BMI 40)    Request for sterilization       Chief Compliant:  Chief Complaint   Patient presents with    R/O Labor     strong regular contractions since 2100.       Uterine Contractions: Yes: Kimball started at 2100, are 10 minutes apart and are lasting 30-90 seconds.    Rupture of Membrane: no    Vaginal Bleeding: no    Fetal Movement:Active    Previous C/S: no    Pre-eclampsia symptoms: no    Urinary tract infection symptoms: no      External fetal monitor and toco applied. VS obtained. Side rails up x2. Call light in reach. Patient awaiting evaluation by provider.    PJesse Sans RN, RN.

## 2016-08-11 ENCOUNTER — Ambulatory Visit: Payer: Medicaid Other | Attending: Obstetrics & Gynecology

## 2016-08-11 DIAGNOSIS — Z3A37 37 weeks gestation of pregnancy: Secondary | ICD-10-CM

## 2016-08-11 DIAGNOSIS — R7303 Prediabetes: Principal | ICD-10-CM | POA: Insufficient documentation

## 2016-08-11 DIAGNOSIS — Z3480 Encounter for supervision of other normal pregnancy, unspecified trimester: Secondary | ICD-10-CM | POA: Insufficient documentation

## 2016-08-11 DIAGNOSIS — O09899 Supervision of other high risk pregnancies, unspecified trimester: Secondary | ICD-10-CM | POA: Insufficient documentation

## 2016-08-11 DIAGNOSIS — O99810 Abnormal glucose complicating pregnancy: Secondary | ICD-10-CM | POA: Insufficient documentation

## 2016-08-11 DIAGNOSIS — O09893 Supervision of other high risk pregnancies, third trimester: Secondary | ICD-10-CM

## 2016-08-11 NOTE — Procedures (Signed)
The NST is reactive without decelerations.   The AFI is adequate.     Recommendations:   Retest in 3-4 days    Read and interpreted by: Vernetta Dizdarevic W Alesia Oshields, MD

## 2016-08-11 NOTE — Procedures (Signed)
ANTENATAL BIOPHYSICAL TESTING   Demographics:  Date: August 11, 2016   Patient Name: Theresa Pollard   Medical Record #: [id number]   DOB: July 12, 1980  Age: 36 year old  Sex: female  GA: [redacted]w[redacted]d   Obstetric History    G5   P4   T4   P0   A0   L4     SAB0   TAB0   Ectopic0   Multiple0   Live Births4    Obstetric Comments   Normal pap 2017.   No history of abnormal paps.    No GC/CT.    Irregular menses. Firm LMP.    GBS with prior pregnancies: no     BP 110/67 (BP Location: Left arm, BP Patient Position: Sitting, BP cuff size: Large)   Pulse 89   LMP 11/21/2015 (Exact Date)    Clinic Location:  [facility] AMBULATORY CARE CTR  [place] [facility] SERVICES  [address]  [place] [address]  Requested by:      Phoebe Perch    Encounter Diagnoses   Name Primary?    Prediabetes     Multigravida, antepartum     Abnormal maternal glucose tolerance, antepartum     Supervision of other high risk pregnancy, antepartum      Fetal Kick Counts: done - > 10 per hour  Tobacco History:   History   Smoking Status    Former Smoker   Smokeless Tobacco    Never Used     Medications:   Current Outpatient Prescriptions   Medication Sig    glucose blood test strip 1 strip by Other route 5 times daily.    insulin aspart (NOVOLOG) 100 UNIT/ML injection Inject 16 units with breakfast, 12 units with lunch and 46 units with dinner    insulin NPH (HUMULIN N) 100 UNIT/ML injection Inject 18 units before breakfast, 50 units after 10 PM    Insulin Syringe-Needle U-100 (INS SYR UL THIN SHORT .5CC/30G) 30G X 5/16" 0.5 ML MISC As needed 4-6 times daily    lancets 1 Lancet by Other route 5 times daily.    prenatal vitamin (PRENATAL VITAMIN W/ IRON W/ FOLIC ACID) tablet Take 1 tablet by mouth daily.     No current facility-administered medications for this visit.        Amniotic Fluid Index        2.79         1.96         4.01         2.05     Total: 10.81        ULTRASOUND DOCUMENTATION  Fetus: single  Presentation:  vertex  Placenta: posterior  Fetal cardiac activity noted by mother: yes        Nonstress Test: Reactive: Accels > or =  to 15 BPM x 15 secs x 2  Baseline: 140  Variability: Moderate 6 - 25 bpm  Contractions: none  Time on: 0934     Time Off: 0957    Handouts given: None Given  Patient Education: NST, AFI and FKC    To Labor and Delivery for further evaluation:no      Plan: Repeat NST/AFI:2x per week    [name], RN

## 2016-08-14 ENCOUNTER — Telehealth (HOSPITAL_BASED_OUTPATIENT_CLINIC_OR_DEPARTMENT_OTHER): Payer: Self-pay

## 2016-08-14 ENCOUNTER — Ambulatory Visit: Payer: Medicaid Other | Attending: Obstetrics & Gynecology

## 2016-08-14 DIAGNOSIS — Z3A38 38 weeks gestation of pregnancy: Secondary | ICD-10-CM

## 2016-08-14 DIAGNOSIS — R7303 Prediabetes: Principal | ICD-10-CM | POA: Insufficient documentation

## 2016-08-14 DIAGNOSIS — O9989 Other specified diseases and conditions complicating pregnancy, childbirth and the puerperium: Secondary | ICD-10-CM

## 2016-08-14 DIAGNOSIS — O09523 Supervision of elderly multigravida, third trimester: Secondary | ICD-10-CM

## 2016-08-14 DIAGNOSIS — Z3480 Encounter for supervision of other normal pregnancy, unspecified trimester: Secondary | ICD-10-CM | POA: Insufficient documentation

## 2016-08-14 DIAGNOSIS — O99810 Abnormal glucose complicating pregnancy: Secondary | ICD-10-CM | POA: Insufficient documentation

## 2016-08-14 NOTE — Procedures (Signed)
ANTENATAL BIOPHYSICAL TESTING   Demographics:  Date: August 14, 2016   Patient Name: Theresa Pollard   Medical Record #: [id number]   DOB: 28-May-1980  Age: 36 year old  Sex: female  GA: [redacted]w[redacted]d   Obstetric History    G5   P4   T4   P0   A0   L4     SAB0   TAB0   Ectopic0   Multiple0   Live Births4    Obstetric Comments   Normal pap 2017.   No history of abnormal paps.    No GC/CT.    Irregular menses. Firm LMP.    GBS with prior pregnancies: no     BP 123/79 (BP Location: Left arm, BP Patient Position: Sitting, BP cuff size: Large)   Pulse 79   LMP 11/21/2015 (Exact Date)    Clinic Location:  [facility] AMBUL[facility]  [facility] SERVICES  [address]  [place] [address]  Requested by:      Phoebe Perch    Encounter Diagnoses   Name Primary?    Prediabetes     Multigravida, antepartum     Abnormal maternal glucose tolerance, antepartum      Fetal Kick Counts: done - > 10 per hour  Tobacco History:   History   Smoking Status    Former Smoker   Smokeless Tobacco    Never Used     Medications:   Current Outpatient Prescriptions   Medication Sig    glucose blood test strip 1 strip by Other route 5 times daily.    insulin aspart (NOVOLOG) 100 UNIT/ML injection Inject 16 units with breakfast, 12 units with lunch and 46 units with dinner    insulin NPH (HUMULIN N) 100 UNIT/ML injection Inject 18 units before breakfast, 50 units after 10 PM    Insulin Syringe-Needle U-100 (INS SYR UL THIN SHORT .5CC/30G) 30G X 5/16" 0.5 ML MISC As needed 4-6 times daily    lancets 1 Lancet by Other route 5 times daily.    prenatal vitamin (PRENATAL VITAMIN W/ IRON W/ FOLIC ACID) tablet Take 1 tablet by mouth daily.     No current facility-administered medications for this visit.        Amniotic Fluid Index        1.10         2.56         2.01         00     Total: 5.67    Amniotic Fluid Index         1.89         1.4       2.14         00     Total: 5.43    Amniotic Fluid Index            2.37         .97       2.21         00     Total: 5.55  2.56 x 2.11 pocket       ULTRASOUND DOCUMENTATION  Fetus: single  Presentation: vertex  Placenta: anterior  Fetal cardiac activity noted by mother: yes    Nonstress Test: Reactive: Accels > or =  to 15 BPM x 15 secs x 2  Baseline: 140  Variability: Moderate 6 - 25 bpm  Contractions: none  Time on: 0936  Time Off: 1002    Handouts given: None Given  Patient Education: NST, AFI and FKC    To Labor and Delivery for further evaluation:no    Assessment: AFI borderline low, reviewed with Dr. Midge Minium will return to [facility] for repeat AFI tomorrow at 9:30    Plan: Repeat NST/AFI:2x per week    Kennyth Lose, RN

## 2016-08-14 NOTE — Procedures (Signed)
FHT: 120, mod var, +accels, no decels  The NST is reactive.  The AFI is adequate based on 4 quadrant testing and single quadrant evaluation criteria, but borderline.    Recommendations:   Recommend repeat AFI tomorrow. Patient will return for repeat ultrasound.    Larey Brick, MD

## 2016-08-14 NOTE — Telephone Encounter (Signed)
Theresa Pollard is a 36 year old female G5P4004 at gestational age [redacted]w[redacted]d Estimated Date of Delivery: 08/27/16    Pt of Diabob  Diabetes type:GDMa2 dx at 15 weeks via 1-hr GTT 229 mg/dl, U9W 6.1 on 11/91/47  Wt: 191 lbs    Chief Complaint   Patient presents with    Diabetes     blood glucose review     Times of Day Glucose Tested & Results   Apr Med changes 07/28/16 Med changes 07/31/16 Med Changes 08/07/16 Med changes  08/14/16 PP Regimen if needed   Fasting  94 79 81 88 91 75 92 NPH 18 units, Novolog 16 units, MOMPOD 2 tablets with breakfast No change No change NPH 16 units, Novolog 16 units, MOMPOD 2 tablets with breakfast Metformin 500 mg with breakfast   After Bkfast 115 121 118 102 90 115 114             60s          After Lunch 106 110 133 106 112 115  Novolog 14 units with lunch No change No change No change none   After Dinner 130 129 112 111 113 151  Novolog 52 units (split dose)  MOMPOD 2 tablets with dinner  Novolog 56 units (split dose)  MOMPOD 2 tablets with dinner  Novolog 62 units (split dose)  MOMPOD 2 tablets with dinner  No change Metformin 500 mg with dinner   Bedtime        NPH 56 units at 10 PM or later (split dose) NPH 66 units at 10 PM or later (split dose) NPH 72 units at 10 PM or later (split dose) NPH 66 units at 10 PM or later (split dose) none   Early AM   (0200-0300)      60s           Assessment / Plan:    1. Diabetes: Reviewed pt blood glucose log at NST today.  Pt blood glucose decreased significaly from previous week.  Discussed how decreasing hormones correlated with decreasing BG.  Pt then reports couple lows in last couple days.  Pt did not record but reports before lunch once and early am this am.  Medication changes per MD.  Pt to cont testing and send in log weekly for review.    2. Routine OB/Fetus: Diabob weekly, growth sono complete, NST twice a week, FM WNL, IOL at 38.5 wks (scheduled 4/18 at 8 am, pt to call when she needs to leave her house, drive maybe >1  hr)    3. Education discussed: Blood glucose / urine monitoring:  how/when to report values  Fetal Surveillance: ultrasound, fetal movement count instruction and NST/CST  Medication: dose / administration and adjustments  Hypoglycemia: signs / symptoms  Postpartum f/u:  2 hr PP GTT rationale/Risk Type 2, nutrition, weight management, exercise and PCP follow up (annual exam)    Plan of care discussed with patient and patient verbalized understanding: Yes.

## 2016-08-14 NOTE — Telephone Encounter (Signed)
Glycemic control and medications reviewed.  Medication changes per my orders.  Theresa Pollard

## 2016-08-15 ENCOUNTER — Ambulatory Visit: Payer: Medicaid Other | Attending: Obstetrics & Gynecology

## 2016-08-15 DIAGNOSIS — O09523 Supervision of elderly multigravida, third trimester: Secondary | ICD-10-CM | POA: Insufficient documentation

## 2016-08-15 DIAGNOSIS — O09899 Supervision of other high risk pregnancies, unspecified trimester: Secondary | ICD-10-CM | POA: Insufficient documentation

## 2016-08-15 DIAGNOSIS — O99810 Abnormal glucose complicating pregnancy: Secondary | ICD-10-CM | POA: Insufficient documentation

## 2016-08-15 DIAGNOSIS — O288 Other abnormal findings on antenatal screening of mother: Secondary | ICD-10-CM | POA: Insufficient documentation

## 2016-08-15 DIAGNOSIS — Z3480 Encounter for supervision of other normal pregnancy, unspecified trimester: Secondary | ICD-10-CM | POA: Insufficient documentation

## 2016-08-15 DIAGNOSIS — R7303 Prediabetes: Principal | ICD-10-CM | POA: Insufficient documentation

## 2016-08-15 NOTE — Procedures (Signed)
ANTENATAL BIOPHYSICAL TESTING   Demographics:  Date: August 15, 2016   Patient Name: Theresa Pollard   Medical Record #: [id number]   DOB: [birth year]  Age: 36 year old  Sex: female  GA: [redacted]w[redacted]d   Obstetric History    G5   P4   T4   P0   A0   L4     SAB0   TAB0   Ectopic0   Multiple0   Live Births4    Obstetric Comments   Normal pap 2017.   No history of abnormal paps.    No GC/CT.    Irregular menses. Firm LMP.    GBS with prior pregnancies: no     BP 114/87 (BP Location: Right arm, BP Patient Position: Sitting, BP cuff size: Large)   Pulse 71   LMP 11/21/2015 (Exact Date)    Clinic Location:  [facility] AMBULAT[facility]  [facility] SERVICES  [address]  [place] [address]  Requested by:      Roda Shutters    Encounter Diagnoses   Name Primary?    Prediabetes     Multigravida, antepartum     Abnormal maternal glucose tolerance, antepartum     Supervision of other high risk pregnancy, antepartum     Comment: AFI borderline low     Fetal Kick Counts: done - > 10 per hour  Tobacco History:   History   Smoking Status    Former Smoker   Smokeless Tobacco    Never Used     Medications:   Current Outpatient Prescriptions   Medication Sig    glucose blood test strip 1 strip by Other route 5 times daily.    insulin aspart (NOVOLOG) 100 UNIT/ML injection Inject 16 units with breakfast, 12 units with lunch and 46 units with dinner    insulin NPH (HUMULIN N) 100 UNIT/ML injection Inject 18 units before breakfast, 50 units after 10 PM    Insulin Syringe-Needle U-100 (INS SYR UL THIN SHORT .5CC/30G) 30G X 5/16" 0.5 ML MISC As needed 4-6 times daily    lancets 1 Lancet by Other route 5 times daily.    prenatal vitamin (PRENATAL VITAMIN W/ IRON W/ FOLIC ACID) tablet Take 1 tablet by mouth daily.     No current facility-administered medications for this visit.        Amniotic Fluid Index        3.05         1.2         00         4.16     Total: 8.41  2.11 x 2.73 pocket  noted      ULTRASOUND DOCUMENTATION  Fetus: single  Presentation: vertex  Placenta: posterior  Fetal cardiac activity noted by mother: yes    Nonstress Test: Reactive: Accels > or =  to 15 BPM x 15 secs x 2  Baseline: 125  Variability: Moderate 6 - 25 bpm  Contractions: x1   Time on: 0932     Time Off: 1000    Handouts given: None Given  Patient Education: NST, AFI, FKC, LABOR and INCREASE FLUID INTAKE    To Labor and Delivery for further evaluation:no    Plan: Repeat NST/AFI:2x per week    Kennyth Lose, RN

## 2016-08-15 NOTE — Procedures (Signed)
The NST is reactive without decelerations.   The AFI is adequate.     Recommendations:   Retest in 3-4 days    Read and interpreted by: Biff Rutigliano Mislinski Tatem Fesler, MD

## 2016-08-17 NOTE — Assessment & Plan Note (Addendum)
See CDE note for blood sugars and current medication regimen  A1c 6.2 on 07/31/16  - 36 wk growth sono: 36+1/35+3, cephalic, posterior placenta, AFI 5.5 cm, MVP 2.0 cm, EFW 2770g (6lbs 2 oz) 42%ile, HC/AC 0.98, AC 79%ile, UA S/D 2.46, FL lagging 4%ile  - Continue NST/AFI 2x/week  - Okay for trial of labor, normally grown

## 2016-08-17 NOTE — Assessment & Plan Note (Signed)
Last Zika serum negative on 07/31/16  - reviewed precautions including bug spray with deet, long sleeve shirts, and condoms

## 2016-08-17 NOTE — Assessment & Plan Note (Signed)
AFI 5.5 cm/ MVP 2.0 cm  - Continue NST/AFI 2x/week

## 2016-08-17 NOTE — Assessment & Plan Note (Signed)
S/p Rhogam 06/09/16

## 2016-08-17 NOTE — Assessment & Plan Note (Signed)
Negative NIPT

## 2016-08-18 ENCOUNTER — Ambulatory Visit (HOSPITAL_BASED_OUTPATIENT_CLINIC_OR_DEPARTMENT_OTHER): Payer: Medicaid Other | Admitting: Obstetrics & Gynecology

## 2016-08-18 ENCOUNTER — Ambulatory Visit: Payer: Medicaid Other | Attending: Obstetrics & Gynecology

## 2016-08-18 DIAGNOSIS — R7303 Prediabetes: Secondary | ICD-10-CM | POA: Insufficient documentation

## 2016-08-18 DIAGNOSIS — Z20828 Contact with and (suspected) exposure to other viral communicable diseases: Secondary | ICD-10-CM | POA: Insufficient documentation

## 2016-08-18 DIAGNOSIS — Z20821 Contact with and (suspected) exposure to Zika virus: Secondary | ICD-10-CM

## 2016-08-18 DIAGNOSIS — O09523 Supervision of elderly multigravida, third trimester: Secondary | ICD-10-CM | POA: Insufficient documentation

## 2016-08-18 DIAGNOSIS — O4100X0 Oligohydramnios, unspecified trimester, not applicable or unspecified: Secondary | ICD-10-CM | POA: Insufficient documentation

## 2016-08-18 DIAGNOSIS — O26899 Other specified pregnancy related conditions, unspecified trimester: Secondary | ICD-10-CM

## 2016-08-18 DIAGNOSIS — Z006 Encounter for examination for normal comparison and control in clinical research program: Secondary | ICD-10-CM

## 2016-08-18 DIAGNOSIS — O09899 Supervision of other high risk pregnancies, unspecified trimester: Secondary | ICD-10-CM | POA: Insufficient documentation

## 2016-08-18 DIAGNOSIS — O9989 Other specified diseases and conditions complicating pregnancy, childbirth and the puerperium: Secondary | ICD-10-CM

## 2016-08-18 DIAGNOSIS — O360131 Maternal care for anti-D [Rh] antibodies, third trimester, fetus 1: Secondary | ICD-10-CM

## 2016-08-18 DIAGNOSIS — Z3480 Encounter for supervision of other normal pregnancy, unspecified trimester: Principal | ICD-10-CM | POA: Insufficient documentation

## 2016-08-18 DIAGNOSIS — O418X30 Other specified disorders of amniotic fluid and membranes, third trimester, not applicable or unspecified: Secondary | ICD-10-CM

## 2016-08-18 DIAGNOSIS — O288 Other abnormal findings on antenatal screening of mother: Secondary | ICD-10-CM | POA: Insufficient documentation

## 2016-08-18 DIAGNOSIS — Z3A38 38 weeks gestation of pregnancy: Secondary | ICD-10-CM

## 2016-08-18 DIAGNOSIS — Z6791 Unspecified blood type, Rh negative: Secondary | ICD-10-CM

## 2016-08-18 NOTE — Assessment & Plan Note (Signed)
Negative NIPT

## 2016-08-18 NOTE — Patient Instructions (Signed)
Your pregnancy at 39 weeks    At 39 weeks, your baby is getting ready for birth. All your baby's organs are fully functional and your baby continues to build up fat stores.    Do your fetal movement counts once per day each day.  If you have any concerns about your baby's movements, or if it is taking longer than one hour to get your movement counts, call us right away.    Call or go to Labor and Delivery if you are having any of the following:    1.  Leaking fluid or think that you have broken your water,  2.  Vaginal bleeding,  3.  Painful contractions occurring every 5 minutes for 2 hours or more (every 10 minutes for one hour if you have delivered a baby before).    Pain Relief Options    At [facility] we offer multiple options for pain relief during labor. Some women may choose to labor without pain medications. If you decide that you need help with pain during labor, let us know. Options include IV narcotic pain medications early in labor, inhaled nitrous oxide or an epidural from one of our anesthesiologists.    CONTACT INFORMATION  Please call the clinic M-F during business hours for questions or concerns    [facility]   [phone]  [facility]  [phone]  [facility]  [phone]    For after hours urgent questions or labor concerns      For MD patients delivering at [facility]   Call Labor and Delivery at   [phone]  OR  [facility] Doctor on call for [facility]  [phone]    For midwife patients delivering at [facility]  Call [phone]    For MD or CNM patients delivering at [facility]  Call Labor and Delivery at  [phone]  OR  Call [phone] for the [facility] Doctor on call

## 2016-08-18 NOTE — Progress Notes (Signed)
MFM Fellow DIABOB visit    Theresa Pollard is a 36 year old G5P4004 at [redacted]w[redacted]d here for routine prenatal care.     Prediabetes  Please see CDE note for glucose values and regimen changes.  Last A1c 6.2 on 07/31/16  36 wk growth sono: AFI 5.5 cm, EFW 2770g 42%ile, HC/AC 0.98, AC 79%ile  IOL scheduled 4/18 at 8am. Membranes swept today by Dr. Kandyce Rud.  Postpartum regimen: metformin  PO BID    Rh negative state in antepartum period  S/p Rhogam 06/09/16    Zika virus exposure  Zika negative 07/31/16  Continues to travel to [place], using precautions.    AMA (advanced maternal age) multigravida 35+  Negative NIPT    Amniotic fluid index borderline low  AFI 5.5cm/ MVP 2.0cm on last growth sono. AFI 8 today.  Continue antenatal [place] [id number]  Maternal-Fetal Medicine Fellow  Pager [phone]      Patient discussed with Dr. Ethelene Hal who agrees with the plan.

## 2016-08-18 NOTE — Procedures (Signed)
The NST is reactive without decelerations.   The AFI is adequate.     Recommendations:   Retest in 3-4 days    Read and interpreted by: Cloma Rahrig W Erland Vivas, MD

## 2016-08-18 NOTE — Progress Notes (Signed)
Attending  Patient seen.  Chart, assessment and plan reviewed with Resident.  Theresa Pollard is a 36 year old female with G5P4004 at [redacted]w[redacted]d with prediabetes.  Medication changes noted.  Continue close surveillance for pre-eclampsia. Growth sono reviewed and without macrosomia.  Strict FKC.  Continue NST/AFI. Scheduled for induction on Wednesday.  Amaryllis Dyke, MD

## 2016-08-18 NOTE — Assessment & Plan Note (Signed)
S/p Rhogam 06/09/16

## 2016-08-18 NOTE — Progress Notes (Signed)
Clinic Note    Subjective:  [name] is a 36 year old G5P4004 @[redacted]w[redacted]d  by LMP c/w 42w1dsono who presents for return OB visit. Pregnancy complicated by the following:    Patient Active Problem List   Diagnosis    Prediabetes    Rh negative state in antepartum period    Need for MMR vaccine    Zika virus exposure    AMA (advanced maternal age) multigravida 35+    Patient consented for MOMPOD Study (IRB [id number]    Amniotic fluid index borderline low    Obesity (BMI 40)    Request for sterilization    Irregular contractions      Patient on review of systems denies leakage of fluid, vaginal bleeding, contractions. +fetal movement and kick counts <1 hour. Denies headache, changes in vision, RUQ pain. No dysuria.    Patient reports worsened swelling in hands and feet the past few weeks.     Objective:  BP 122/78 (BP Location: Left arm, BP Patient Position: Sitting, BP cuff size: Large)   Pulse 87   Temp 98.1 F (36.7 C) (Oral)   Ht 4' 7"  (1.397 m)   Wt 89.8 kg (198 lb)   LMP 11/21/2015 (Exact Date)   BMI 46.02 kg/m2  Total Weight Gain: 15 kg (33 lb)     FH: 40 cm  Doptones: 145 bpm    SVE: 1cm/60%/-2    Assessment/Plan:   Prediabetes  See CDE note for blood sugars and current medication regimen. A1c 6.2 on 07/31/16  - 36 wk growth sono: 308+1/44+8 cephalic, posterior placenta, AFI 5.5 cm, MVP 2.0 cm, EFW 2770g (6lbs 2 oz) 42%ile, HC/AC 0.98, AC 79%ile, UA S/D 2.46, FL lagging 4%ile  - Continue NST/AFI 2x/week  - Okay for vaginal delivery  - Induction scheduled 08/21/15    Rh negative state in antepartum period  S/p Rhogam 06/09/16    Zika virus exposure  Zika serum negative 07/31/16  - Reviewed precautions: deet bug spray, long sleeve shirts and pants, and condoms    AMA (advanced maternal age) multigravida 35+  Negative NIPT    Amniotic fluid index borderline low  AFI 5.5cm/ MVP 2.0cm on formal scan. Repeat AFI at antenatal testing today 8.57 cm. Single deepest pocket - 2.01x2.83  - Continue antenatal  testing      Patient plan of care discussed with Dr. RNelva Pollard     Theresa Pollard CRise Paganini MD, [place], [id number] Signature Derived From Controlled Access Password, August 18, 2016, 12:49 PM

## 2016-08-18 NOTE — Procedures (Signed)
ANTENATAL BIOPHYSICAL TESTING   Demographics:  Date: August 18, 2016   Patient Name: Theresa Pollard   Medical Record #: [id number]   DOB: 12-Dec-1980  Age: 36 year old  Sex: female  GA: [redacted]w[redacted]d   Obstetric History    G5   P4   T4   P0   A0   L4     SAB0   TAB0   Ectopic0   Multiple0   Live Births4    Obstetric Comments   Normal pap 2017.   No history of abnormal paps.    No GC/CT.    Irregular menses. Firm LMP.    GBS with prior pregnancies: no     BP 127/66 (BP Location: Right arm, BP Patient Position: Semi-Fowlers, BP cuff size: Large)   Pulse 84   LMP 11/21/2015 (Exact Date)    Clinic Location:  [facility]  [facility] SERVICES  [address]  [place] [address]  Requested by:      Self, Referred    Encounter Diagnoses   Name Primary?    Multigravida, antepartum     Oligohydramnios, antepartum, single or unspecified fetus      Fetal Kick Counts: done - > 10 per hour and reviewed and reinforced  Tobacco History:   History   Smoking Status    Former Smoker   Smokeless Tobacco    Never Used     Medications:   Current Outpatient Prescriptions   Medication Sig    glucose blood test strip 1 strip by Other route 5 times daily.    insulin aspart (NOVOLOG) 100 UNIT/ML injection Inject 16 units with breakfast, 12 units with lunch and 46 units with dinner    insulin NPH (HUMULIN N) 100 UNIT/ML injection Inject 18 units before breakfast, 50 units after 10 PM    Insulin Syringe-Needle U-100 (INS SYR UL THIN SHORT .5CC/30G) 30G X 5/16" 0.5 ML MISC As needed 4-6 times daily    lancets 1 Lancet by Other route 5 times daily.    prenatal vitamin (PRENATAL VITAMIN W/ IRON W/ FOLIC ACID) tablet Take 1 tablet by mouth daily.     No current facility-administered medications for this visit.      [place] by Architect    Amniotic Fluid Index        2.47         0.00         3.70         2.40     Total: 8.57    Single deepest pocket - 2.01x2.83    ULTRASOUND DOCUMENTATION  Fetus:  single  Presentation: vertex  Placenta: posterior  Fetal cardiac activity noted by mother: yes    Nonstress Test: Reactive: Accels > or =  to 15 BPM x 15 secs x 2  Baseline: 145  Variability: Moderate 6 - 25 bpm  Contractions: 1  Time on: 0919     Time Off: 0942    Handouts given: None Given - declined  Patient Education: NST, AFI, FKC, PIH and LABOR    Plan: Repeat NST/AFI:prn - IOL 4/18    Alverna Fawley, RN, RNC-OB

## 2016-08-18 NOTE — Assessment & Plan Note (Addendum)
See CDE note for blood sugars and current medication regimen. A1c 6.2 on 07/31/16  - 36 wk growth sono: 36+1/35+3, cephalic, posterior placenta, AFI 5.5 cm, MVP 2.0 cm, EFW 2770g (6lbs 2 oz) 42%ile, HC/AC 0.98, AC 79%ile, UA S/D 2.46, FL lagging 4%ile  - Continue NST/AFI 2x/week  - Okay for vaginal delivery  - Induction scheduled 08/21/15

## 2016-08-18 NOTE — Assessment & Plan Note (Signed)
Zika serum negative 07/31/16  - Reviewed precautions: deet bug spray, long sleeve shirts and pants, and condoms

## 2016-08-18 NOTE — Assessment & Plan Note (Addendum)
AFI 5.5cm/ MVP 2.0cm on formal scan. Repeat AFI at antenatal testing today 8.57 cm. Single deepest pocket - 2.01x2.83  - Continue antenatal testing

## 2016-08-20 ENCOUNTER — Inpatient Hospital Stay
Admission: AD | Admit: 2016-08-20 | Discharge: 2016-08-25 | DRG: 540 | Disposition: A | Discharge status: Home / Self Care | Payer: Medicaid Other | Attending: Obstetrics & Gynecology | Admitting: Obstetrics & Gynecology

## 2016-08-20 ENCOUNTER — Inpatient Hospital Stay (HOSPITAL_COMMUNITY): Payer: Medicaid Other | Admitting: Anesthesiology

## 2016-08-20 ENCOUNTER — Encounter (HOSPITAL_COMMUNITY): Payer: Self-pay | Admitting: Obstetrics & Gynecology

## 2016-08-20 DIAGNOSIS — Z87440 Personal history of urinary (tract) infections: Secondary | ICD-10-CM

## 2016-08-20 DIAGNOSIS — Z20828 Contact with and (suspected) exposure to other viral communicable diseases: Secondary | ICD-10-CM | POA: Diagnosis present

## 2016-08-20 DIAGNOSIS — Z87891 Personal history of nicotine dependence: Secondary | ICD-10-CM

## 2016-08-20 DIAGNOSIS — Z6841 Body Mass Index (BMI) 40.0 and over, adult: Secondary | ICD-10-CM

## 2016-08-20 DIAGNOSIS — O9912 Other diseases of the blood and blood-forming organs and certain disorders involving the immune mechanism complicating childbirth: Secondary | ICD-10-CM | POA: Diagnosis present

## 2016-08-20 DIAGNOSIS — O0990 Supervision of high risk pregnancy, unspecified, unspecified trimester: Secondary | ICD-10-CM | POA: Diagnosis present

## 2016-08-20 DIAGNOSIS — O24425 Gestational diabetes mellitus in childbirth, controlled by oral hypoglycemic drugs: Principal | ICD-10-CM | POA: Diagnosis present

## 2016-08-20 DIAGNOSIS — E669 Obesity, unspecified: Secondary | ICD-10-CM | POA: Diagnosis present

## 2016-08-20 DIAGNOSIS — Z23 Encounter for immunization: Secondary | ICD-10-CM | POA: Diagnosis present

## 2016-08-20 DIAGNOSIS — Z7984 Long term (current) use of oral hypoglycemic drugs: Secondary | ICD-10-CM

## 2016-08-20 DIAGNOSIS — D72829 Elevated white blood cell count, unspecified: Secondary | ICD-10-CM | POA: Diagnosis not present

## 2016-08-20 DIAGNOSIS — Z09 Encounter for follow-up examination after completed treatment for conditions other than malignant neoplasm: Secondary | ICD-10-CM

## 2016-08-20 DIAGNOSIS — Z302 Encounter for sterilization: Secondary | ICD-10-CM

## 2016-08-20 DIAGNOSIS — Z3490 Encounter for supervision of normal pregnancy, unspecified, unspecified trimester: Secondary | ICD-10-CM | POA: Diagnosis present

## 2016-08-20 DIAGNOSIS — Z006 Encounter for examination for normal comparison and control in clinical research program: Secondary | ICD-10-CM | POA: Diagnosis present

## 2016-08-20 DIAGNOSIS — O24414 Gestational diabetes mellitus in pregnancy, insulin controlled: Secondary | ICD-10-CM

## 2016-08-20 DIAGNOSIS — O288 Other abnormal findings on antenatal screening of mother: Secondary | ICD-10-CM | POA: Diagnosis present

## 2016-08-20 DIAGNOSIS — O09529 Supervision of elderly multigravida, unspecified trimester: Secondary | ICD-10-CM | POA: Diagnosis present

## 2016-08-20 DIAGNOSIS — Z3A39 39 weeks gestation of pregnancy: Secondary | ICD-10-CM

## 2016-08-20 DIAGNOSIS — R7303 Prediabetes: Secondary | ICD-10-CM

## 2016-08-20 DIAGNOSIS — O99214 Obesity complicating childbirth: Secondary | ICD-10-CM | POA: Diagnosis present

## 2016-08-20 LAB — HEMOGRAM, BLOOD
Hct: 36.5 % (ref 34.0–45.0)
Hgb: 12.1 gm/dL (ref 11.2–15.7)
MCH: 29.4 pg (ref 26.0–32.0)
MCHC: 33.2 g/dL (ref 32.0–36.0)
MCV: 88.8 um3 (ref 79.0–95.0)
MPV: 12.4 fL (ref 9.4–12.4)
Plt Count: 183 10*3/uL (ref 140–370)
RBC: 4.11 10*6/uL (ref 3.90–5.20)
RDW: 14.5 % — ABNORMAL HIGH (ref 12.0–14.0)
WBC: 7.7 10*3/uL (ref 4.0–10.0)

## 2016-08-20 LAB — TYPE & SCREEN
ABO/RH: O NEG
Antibody Screen: NEGATIVE

## 2016-08-20 LAB — GLUCOSE (POCT)
Glucose (POCT): 76 mg/dL (ref 70–99)
Glucose (POCT): 108 mg/dL — ABNORMAL HIGH (ref 70–99)
Glucose (POCT): 90 mg/dL (ref 70–99)
Glucose (POCT): 170 mg/dL — ABNORMAL HIGH (ref 70–99)
Glucose (POCT): 100 mg/dL — ABNORMAL HIGH (ref 70–99)
Glucose (POCT): 106 mg/dL — ABNORMAL HIGH (ref 70–99)
Glucose (POCT): 77 mg/dL (ref 70–99)
Glucose (POCT): 96 mg/dL (ref 70–99)

## 2016-08-20 MED ORDER — DEXTROSE (DIABETIC USE) 40 % OR GEL
1.0000 | ORAL | Status: DC | PRN
Start: 2016-08-20 — End: 2016-08-25

## 2016-08-20 MED ORDER — GLUCAGON HCL (RDNA) 1 MG IJ SOLR
1.0000 mg | Freq: Once | INTRAMUSCULAR | Status: DC | PRN
Start: 2016-08-20 — End: 2016-08-25

## 2016-08-20 MED ORDER — FENTANYL 2 MCG/ML-BUPIVACAINE 0.125% IN NACL EPIDURAL (PREMADE)
Status: DC
Start: 2016-08-20 — End: 2016-08-21
  Administered 2016-08-20: 10 mL/h via EPIDURAL
  Administered 2016-08-20: 8 mL/h via EPIDURAL
  Administered 2016-08-21: 10 mL/h via EPIDURAL
  Filled 2016-08-20: qty 250

## 2016-08-20 MED ORDER — INSULIN LISPRO (HUMAN) 100 UNIT/ML SC SOLN (CUSTOM)
0.0000 [IU] | INTRAMUSCULAR | Status: DC
Start: 2016-08-20 — End: 2016-08-21
  Administered 2016-08-20: 1 [IU] via SUBCUTANEOUS
  Filled 2016-08-20: qty 1

## 2016-08-20 MED ORDER — LACTATED RINGERS IV SOLN
INTRAVENOUS | Status: AC
Start: 2016-08-20 — End: 2016-08-21
  Administered 2016-08-20 (×2): via INTRAVENOUS

## 2016-08-20 MED ORDER — SODIUM CHLORIDE 0.9 % IJ SOLN (CUSTOM)
3.0000 mL | Freq: Three times a day (TID) | INTRAMUSCULAR | Status: DC
Start: 2016-08-20 — End: 2016-08-21

## 2016-08-20 MED ORDER — SODIUM CHLORIDE 0.9% TKO INFUSION
INTRAVENOUS | Status: DC | PRN
Start: 2016-08-20 — End: 2016-08-21

## 2016-08-20 MED ORDER — OXYTOCIN INFUSION 20 UNITS/1000 ML LR (PREMIX)
Status: DC | PRN
Start: 2016-08-20 — End: 2016-08-21

## 2016-08-20 MED ORDER — DEXTROSE 50 % IV SOLN
12.5000 g | INTRAVENOUS | Status: DC | PRN
Start: 2016-08-20 — End: 2016-08-25

## 2016-08-20 MED ORDER — TERBUTALINE SULFATE 1 MG/ML IJ SOLN
0.2500 mg | INTRAMUSCULAR | Status: DC | PRN
Start: 2016-08-20 — End: 2016-08-21

## 2016-08-20 MED ORDER — LIDOCAINE-EPINEPHRINE 1.5 %-1:200000 IJ SOLN (CUSTOM)
INTRAMUSCULAR | Status: DC | PRN
Start: 2016-08-20 — End: 2016-08-21
  Administered 2016-08-20: 3 mL via EPIDURAL

## 2016-08-20 MED ORDER — ACETAMINOPHEN 325 MG PO TABS
650.0000 mg | ORAL_TABLET | Freq: Four times a day (QID) | ORAL | Status: DC | PRN
Start: 2016-08-20 — End: 2016-08-21

## 2016-08-20 MED ORDER — MISOPROSTOL 200 MCG OR TABS
800.0000 ug | ORAL_TABLET | Freq: Once | ORAL | Status: DC | PRN
Start: 2016-08-20 — End: 2016-08-21
  Administered 2016-08-21: 800 ug via RECTAL

## 2016-08-20 MED ORDER — LIDOCAINE HCL 1 % IJ SOLN
20.0000 mL | Freq: Once | INTRAMUSCULAR | Status: DC | PRN
Start: 2016-08-20 — End: 2016-08-21

## 2016-08-20 MED ORDER — FENTANYL 2 MCG/ML-BUPIVACAINE 0.125% IN NACL EPIDURAL BOLUS FROM BAG
Status: DC | PRN
Start: 2016-08-20 — End: 2016-08-21
  Administered 2016-08-20: 5 mL via EPIDURAL

## 2016-08-20 MED ORDER — LIDOCAINE HCL 1 % IJ SOLN
1.0000 mL | Freq: Once | INTRAMUSCULAR | Status: DC | PRN
Start: 2016-08-20 — End: 2016-08-21

## 2016-08-20 MED ORDER — OXYTOCIN 10 UNIT/ML IJ SOLN
10.0000 [IU] | Freq: Once | INTRAMUSCULAR | Status: DC | PRN
Start: 2016-08-20 — End: 2016-08-21

## 2016-08-20 MED ORDER — METHYLERGONOVINE MALEATE 0.2 MG/ML IJ SOLN
0.2000 mg | Freq: Once | INTRAMUSCULAR | Status: DC | PRN
Start: 2016-08-20 — End: 2016-08-21

## 2016-08-20 MED ORDER — CARBOPROST TROMETHAMINE 250 MCG/ML IM SOLN
250.0000 ug | Freq: Once | INTRAMUSCULAR | Status: DC | PRN
Start: 2016-08-20 — End: 2016-08-21

## 2016-08-20 MED ORDER — SOD CITRATE-CITRIC ACID 500-334 MG/5ML OR SOLN
30.0000 mL | Freq: Once | ORAL | Status: AC | PRN
Start: 2016-08-20 — End: 2016-08-21
  Administered 2016-08-21: 30 mL via ORAL
  Filled 2016-08-20: qty 30

## 2016-08-20 MED ORDER — OXYTOCIN INFUSION 10 UNITS/500 ML LR
0.0000 m[IU]/min | Status: DC
Start: 2016-08-20 — End: 2016-08-21
  Administered 2016-08-20: 2 m[IU]/min via INTRAVENOUS
  Administered 2016-08-20: 8 m[IU]/min via INTRAVENOUS
  Administered 2016-08-20: 4 m[IU]/min via INTRAVENOUS
  Administered 2016-08-20: 6 m[IU]/min via INTRAVENOUS
  Administered 2016-08-21: 10 m[IU]/min via INTRAVENOUS
  Administered 2016-08-21 (×5): 14 m[IU]/min via INTRAVENOUS
  Administered 2016-08-21: 12 m[IU]/min via INTRAVENOUS
  Administered 2016-08-21 (×2): 14 m[IU]/min via INTRAVENOUS
  Administered 2016-08-21: 16 m[IU]/min via INTRAVENOUS
  Filled 2016-08-20: qty 500

## 2016-08-20 MED ORDER — LACTATED RINGERS IV SOLN
Freq: Once | INTRAVENOUS | Status: AC
Start: 2016-08-20 — End: 2016-08-20
  Administered 2016-08-20: 21:00:00 via INTRAVENOUS

## 2016-08-20 MED ORDER — OXYTOCIN INFUSION 40 UNITS/1000 ML NS (PREMIX)
Freq: Once | Status: AC | PRN
Start: 2016-08-20 — End: 2016-08-21
  Administered 2016-08-21 (×2): 1000 mL via INTRAVENOUS

## 2016-08-20 MED ORDER — SODIUM CHLORIDE 0.9 % IJ SOLN (CUSTOM)
3.0000 mL | INTRAMUSCULAR | Status: DC | PRN
Start: 2016-08-20 — End: 2016-08-21

## 2016-08-20 MED ORDER — NALBUPHINE HCL 10 MG/ML IJ SOLN
5.0000 mg | Freq: Once | INTRAMUSCULAR | Status: DC | PRN
Start: 2016-08-20 — End: 2016-08-21

## 2016-08-20 MED ORDER — ONDANSETRON HCL 4 MG/2ML IV SOLN
4.0000 mg | Freq: Once | INTRAMUSCULAR | Status: DC | PRN
Start: 2016-08-20 — End: 2016-08-20

## 2016-08-20 MED ORDER — ONDANSETRON HCL 4 MG/2ML IV SOLN
4.0000 mg | Freq: Four times a day (QID) | INTRAMUSCULAR | Status: DC | PRN
Start: 2016-08-20 — End: 2016-08-21
  Administered 2016-08-21: 4 mg via INTRAVENOUS
  Filled 2016-08-20: qty 2

## 2016-08-20 MED ORDER — GLUCOSE 4 GM PO CHEW (CUSTOM)
4.0000 | CHEWABLE_TABLET | ORAL | Status: DC | PRN
Start: 2016-08-20 — End: 2016-08-25

## 2016-08-20 MED ORDER — MISOPROSTOL 25 MCG OR TAB (QUARTER TABLET)
25.0000 ug | ORAL_TABLET | Status: DC
Start: 2016-08-20 — End: 2016-08-21
  Administered 2016-08-20: 25 ug via VAGINAL
  Filled 2016-08-20: qty 1

## 2016-08-20 NOTE — Plan of Care (Signed)
Problem: Anxiety  Goal: Alleviation of anxiety  Outcome: Goal Met      Problem: Fluid Volume Imbalance, Risk of  Goal: Absence of intrapartum hemorrhage signs and symptoms  Outcome: Goal Met    Goal: Balanced intake and output  Outcome: Goal Met      Problem: Infection - Risk of, Intrapartum Infection  Goal: Absence of infection signs and symptoms  Outcome: Goal Met      Problem: Labor Process  Goal: Labor progression, first stage, within specified pattern  Outcome: Progressing toward goal, anticipate improvement over: next 12-24 hours  Pt given Miso at 1147. Exam at 1615: 1.5/50/-3. Pt breathing through UCs. Will place FB if pt has not made change with next exam.   Goal: Uterine contraction within specified parameters  Consider using an intrauterine pressure catheter in patients who are difficult to evaluate uterine contractions (eg, obese patients).   Outcome: Progressing toward goal, anticipate improvement over: next 12-24 hours  Pt contracting approx every 1.5-3 mins. Ladson's palpate moderate.   Goal: Labor progression, second stage, within specified pattern  Outcome: Progressing toward goal, anticipate improvement over: next 12-24 hours  Pt 1.5/50/-3. Utilizing position change and walking.     Problem: Maternal-Fetal Status  Goal: Maternal - fetal well being  Outcome: Goal Met      Problem: Pain - Acute  Goal: Reduced pain sensation  Outcome: Progressing toward goal, anticipate improvement over: next 12-24 hours  Pt rating pain 6/10 but declines medication at this time. Breathing through contractions.

## 2016-08-20 NOTE — Anesthesia Procedure Notes (Signed)
Epidural/Spinal Block Procedure Note  Date/Time:  Date & Time: 08/20/2016 9:44 PM   Universal Protocol:  Universal Protocol: Verbal consent obtained, written consent obtained, risks and benefits discussed, patient states understanding of the procedure being performed, the patient's understanding of the procedure matches consent given, procedure consent matches procedure scheduled, relevant documents present and verified, test results available and properly labeled, site marked, imaging studies available, required blood products, implants, devices, and special equipment available and Immediately prior to procedure a time out was called to verify the correct patient, procedure, equipment, support staff and site/side marked as required  Consent given by: patient  Patient identity confirmed by: verbally with patient, arm band and hospital-assigned identification number   Procedure:    Procedure type: Epidural block  Sterile skin prep: Chlorehexadine (Chloraprep)  Patient position: Sitting    Approach: Midline  Interspace: L2-3    Needle gauge used to anesthetize site: 25G  Needle gauge used to perform procedure: 18g  Insertion attempts: 2  no Ane Parasthesia Yes/No    no epidural CSF  Loss of resistance: 6.5 cm.    Catheter insertion depth: 11 cm.      No sign of the following injections: SAB and IV  Test dose: lidocaine 1.5% w/ epinephrine    no Ultrasound Guided Procedure       Comments:

## 2016-08-20 NOTE — Interdisciplinary (Signed)
Dr. Shawn Stall at ebdside discussing risks and benefits of epidural. PAtient is agreeable. Consent signed and witnessed by this RN.

## 2016-08-20 NOTE — Progress Notes (Signed)
Attending Labor Progress Note:    Patient seen and examined.    Pain relief: moderate discomfort with uterine contractions   FHT's: Cat I  TOCO: Q 3, mild to mod  SVE: 1-2/50/soft/posterior/-3    Impression: latent labor  Plan: expectant for now; recheck 4 hours; consider FB if exam unchanged.    Theresa Pollard

## 2016-08-20 NOTE — Progress Notes (Signed)
Labor Progress Note    S: Feeling some discomfort with contractions, breathing through them     O: Patient Vitals for the past 2 hrs:   BP Temp Pulse Resp   08/20/16 1920 128/75 98.1 F (36.7 C) 76 18     SVE: 2/50/-3  FHT: 130's, mod variability, + accelerations, no decelerations  TOCO: q 2-4 minutes    A/P Theresa Pollard is a 36 year old at [id number] admitted for IOL GDMA2 vs T2DM  #. Labor- Minimal change s/p miso this AM, but cervix now soft. Contracting too much for another miso. Recommend foley bulb + pitocin. Cervix stretched with FB placement and FB placed, inflated to 40 cc with NS, and then came out immediately with traction. Pitocin ordered.   #. FWB- Cat 1  #. Pain - tolerable at present  #. GDM vs T2DM - last BG 106  #. Dispo - towards [place]VD    [name], [id number]  Signature Derived From Controlled Access Password, August 20, 2016, 8:36 PM

## 2016-08-20 NOTE — Interdisciplinary (Signed)
Dr Dannielle Huh at bedside discussing POC. SVE performed: 1cm, long, & high.

## 2016-08-20 NOTE — Interdisciplinary (Signed)
Dr Dannielle Huh at bedside to place Misoprostol. Miso placed at 1148. Pt tolerated well.

## 2016-08-20 NOTE — Interdisciplinary (Signed)
Theresa Pollard is a 36 year old G5P4004 at [redacted]w[redacted]d admitted to MW102/VO536 for IOL for GDM. Pt currently denies pain, occasionally feels Garfield's, denies VB, denies LOF, reports +FM. EFM and TOCO applied. Pt and significant other oriented to room, call light, monitors, TV, bathroom. Side rails up x2. Bedside table in reach of pt. Awaiting evaluation by provider.    Bryson Dames, RN

## 2016-08-20 NOTE — Interdisciplinary (Signed)
Report given to Espe RN. Care relinquished.

## 2016-08-20 NOTE — Interdisciplinary (Signed)
Dr Alphonzo Lemmings at bedside discussing POC. Per Dr Alphonzo Lemmings, okay for pt to order a lunch meal at this time.

## 2016-08-20 NOTE — H&P (Addendum)
Obstetric Admission History and Physical    Clinic: Prenatal Care Source: [facility]    No chief complaint on file.      History of Present Illness:  Theresa Pollard [id number] is a 36 year old G5P4004 at 51w0dEGA who presents for  induction of labor for pre-diabetes.    Pt reports: contractions - irregular.   Pt denies: abdominal pain, decreased fetal movement, dysuria, headache , hypertension, leakage of fluid, vaginal bleeding and vaginal discharge    Pregnancy Issues:  #. Desires BTL -- consent signed 04/14/16.   #. Obesity - BMI 40.   #. MOMPOD trial - Contacted research coordinator KAgustina Carolivia epic message and paged Dr. RNelva Bushon admission.  #. 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 Taking Metformin 1000 mg BID, NPH 16 + Humalog 16 w/ breakfast, Humalog 14 with lungh, Humalog 62 and NPH 66 at bedtime.   #. Zika virus exposure - Denies any mosquito bites. [place]. Zika labs (04/14/16): neg and Zika urine (07/14/16): neg, Zika serum (07/31/16): neg.   #. AMA - SIS neg x 4  #. Multigravida - 4 prior vaginal deliveries, 1 induced. Largest baby 7#.  #. Rubella non-immune   #. Rh negative - s/p RhoGam 06/09/16  #. Hx UTI: >100K E coli, tx w/ Keflex, TOC negative 04/14/16    Dating Criteria:  Patient's last menstrual period was 11/21/2015 (exact date).  Dated by: sure LMP  Final EDD: Estimated Date of Delivery: 08/27/16    Ultrasound Exams:   Study Date EGA by u/s Comments   1st Trimester Scan 01/28/16 9+1 consistent with LMP   Anatomy Scan 05/30/16 26+6 normal anatomy and placenta posterior   Most Recent Scan 07/31/16 35+3 EFW 2770g (6lbs 2 oz) 42%ile   HC/AC 0.98, AC 79%ile, UA S/D 2.46, FL lagging 4%ile       Past OB History:  OB History   Gravida Para Term Preterm AB Living   5 4 4  0 0 4   SAB TAB Ectopic Multiple Live Births   0 0 0 0 4      # Outcome Date GA Lbr Len/2nd Weight Sex Delivery Anes PTL Lv   5 Current            4 Term      Vag-Spont   LIV   3 Term      Vag-Spont    LIV   2 Term      Vag-Spont   LIV   1 Term      Vag-Spont   LIV      Obstetric Comments   Normal pap 2017.   No history of abnormal paps.    No GC/CT.    Irregular menses. Firm LMP.    GBS with prior pregnancies: no       History of a newborn with GBS disease: no    GYN history:   History of STI (including genital HSV): no    Medical History:  Past Medical History:   Diagnosis Date    Obesity     Prediabetes      Asthma no  Hypertension no  Transfusions: no  Accepts Blood: yes  Admission Hemorrhage Risk Factor Evaluation  Hemorrhage Risk Factor Low: No previous uterine incision;Singleton pregnancy;No known bleeding disorder;No history of PPH  Hemorrhage Risk Factor Med: Multiple gestation;> 4 previous vaginal birth  Hemorrhage Risk : Medium    Surgical History:  Past  Surgical History:   Procedure Laterality Date    CARPAL TUNNEL RELEASE      CHOLECYSTECTOMY       Prior uterine surgery: No prior uterine surgery      Family History:   No family history on file.   Bleeding abnormalities no  Anesthesia complications no      Social History:  Social History   Substance Use Topics    Smoking status: Former Smoker    Smokeless tobacco: Never Used    Alcohol use Not on file         Social History     Social History Narrative      EDS Score (Calculated): 1 (03/21/2016  2:00 PM)  Medications:  No current facility-administered medications on file prior to encounter.      Current Outpatient Prescriptions on File Prior to Encounter   Medication Sig Dispense Refill    glucose blood test strip 1 strip by Other route 5 times daily. 150 strip 9    insulin aspart (NOVOLOG) 100 UNIT/ML injection Inject 16 units with breakfast, 12 units with lunch and 46 units with dinner 3 vial 2    insulin NPH (HUMULIN N) 100 UNIT/ML injection Inject 18 units before breakfast, 50 units after 10 PM 3 vial 2    Insulin Syringe-Needle U-100 (INS SYR UL THIN SHORT .5CC/30G) 30G X 5/16" 0.5 ML MISC As needed 4-6 times daily 200 each 2     lancets 1 Lancet by Other route 5 times daily. 100 Lancet 9    prenatal vitamin (PRENATAL VITAMIN W/ IRON W/ FOLIC ACID) tablet Take 1 tablet by mouth daily.         Allergies:   Review of patient's allergies indicates no known allergies.    System Review (Pertinent Positives)  As in HPI    Physical Exam     Body mass index is 46.02 kg/(m^2).    General Appearance: no acute distress  Neck:  Neck supple. No adenopathy, thyroid symmetric, normal size.  Heart:  normal rate and regular rhythm, no murmurs, clicks, or gallops.  Lungs: clear to auscultation.  Abdomen: gravid, nontender   Extremities: 1+ edema  Neuro: no clonus, 2+ deep tendon reflexes    Pelvic:  SSE: deferred  SVE: 1cm / 0% / -4 station / posterior position / medium consistency  Total Bishop score if indicated:    Cervical Dilation: 1 = 1 cm to 2 cm  Cervical Effacement: 0 = 0 - 30%  Cervical Position: 0 = posterior  Cervical Consistency: 1 = medium  Fetal Station: 0 = -4 to -3  Bishop Score:  1  Clinical Pelvimetry: Adequate    Clinical EFW: 7.5  FHR: baseline 140, moderate variability, yes accelerations, no decelerations  Uterine Activity: irregular contractions  Bedside ultrasound: cephalic    Induction of Labor Checklist    Planned Induction Date: 08/20/16    Gravity/Parity: [id number]    Indication: (select all appropriate indications):  Level 2: Gestational diabetes    Confirmation of gestational age:    LMP: Patient's last menstrual period was 11/21/2015 (exact date).  EDC: 08/27/16 determined by (select all that apply):  Ultrasound obtained at < 20 weeks on (date): 01/27/17, at (gestational age): 73w1dweeks confirms gestational age      Bishop Score    Cervical Dilation: 1 = 1 cm to 2 cm  Cervical Effacement: 0 = 0 - 30%  Cervical Position: 0 = posterior  Cervical Consistency: 1 = medium  Fetal  Station: 0 = -4 to -3  Bishop Score:  1       A Bishop Score > 6 is required for elective induction of multiparous patients.         Assessment/Plan:  36 year  old G5P4004 at 89w0dadmitted for induction of labor.  - Admit to: Labor and Delivery. Plan for induction of labor for GDMA2. Patient 1 cm dilated, long and high on exam, plan for induction of labor with vaginal misoprostol then Foley bulb.    #. 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 1 hr GTT = 229, most recent A1c 6.2 on 07/31/16 Taking Metformin 1000 mg BID, NPH 16 + Humalog 16 w/ breakfast, Humalog 14 with lungh, Humalog 62 and NPH 66 at bedtime. No evidence of fetal macrosomia at 07/31/16 scan, EFW 42%ile.  -- FSG and ISS intrapartum and pospartum    #. Desires BTL -- consent signed 04/14/16. Patient affirms on admission she still desires BTL.    #. Obesity - BMI 40.     #. MOMPOD trial - Contacted research coordinator KAgustina Carolivia epic message and paged Dr. RNelva Bushon admission.    #. Zika virus exposure - Denies any mosquito bites. Had [place] travel [place]. Zika labs (04/14/16): neg and Zika urine (07/14/16): neg, Zika serum (07/31/16): neg.   -- Placenta to pathology given [place]    #. AMA - SIS neg x 4    #. Multigravida - 4 prior vaginal deliveries, 1 induced. Largest baby 7#.  -- T&S on admission    #. Rubella non-immune -- MMR prior to discharge.     #. Rh negative - s/p RhoGam 06/09/16    #. Hx UTI: >100K E coli, tx w/ Keflex, TOC negative 04/14/16    - Fetal well-being: Category 1. Plan: continuous FHR monitoring  - Pain Management anesthesia consult  - Potential neonatal issues: no, Pediatrics notified: no  - Prenatal labs:  - MBT O neg  - Antibody Screen negative   - Rubella Non-Immune  - HBsAg negative  - GC negative  - Chlamydia negative  - Syphilis nonreactive  - GBS  negative  - HIV nonreactive  - PPD/Quantiferon negative  - Chest X ray not done  - Genetic screening: SIS neg x 4  - CF screening: negative  - Diabetes screening:  1 hr GTT = 229 (03/06/16), A1C 6.1 (03/24/16).     - Consultations: Anesthesia  - Feeding Plan: Breastmilk  - Disposition:  Anticipate NSVD  - Immunization plan: MMR   - Desires BTL: Yes - consent signed 04/14/16. Papers valid: Yes  - Candidate for immediate postpartum LARC: No  - Is patient enrolled in research study? Yes, will address - MOMPOD. Research coordinator contacted as well as Dr. RNelva Bushon admission.  - Is patient eligible for research study? not applicable  - Placental disposition to pathology    Code Status: Full code  Discussed with Dr. HJoellyn Haff attending physician.    ERico Ala PGY-1  Family Medicine    EDanbury 08/20/2016, 9:13 AM    Addendum:  Note and plan of care reviewed and edited as appropriate.     CAntony Haste MD PY-4  Obstetrics and Gynecology  PID [id number] Pager [phone]   08/20/2016 1:19 PM

## 2016-08-20 NOTE — Progress Notes (Signed)
L&D Attending Addendum:  Patient seen and examined with residents.    36 year old G5P4004 with IUP at [redacted]w[redacted]d, GBS neg  IOL for Prediabetes  History of 4 uncomplicated SVD's    Temperature:  [98.1 F (36.7 C)-98.6 F (37 C)] 98.1 F (36.7 C) (04/18 1920)  Blood pressure (BP): (122-135)/(73-77) 128/75 (04/18 1920)  Heart Rate:  [74-79] 76 (04/18 1920)  Respirations:  [18] 18 (04/18 1920)  Pain Score: 9 (04/18 2131)    FHT: 130s, mod var, +accels, no decels  Toco: Stafford's q2-21min    EFW 2770g at 36wk [place]    Plan:  - IOL  - Accuchecks q4hrs latent, q2hrs active, q1hr 2nd stage  - Anticipate SVD    [place] [id number]  Pager: [phone]

## 2016-08-20 NOTE — Anesthesia Preprocedure Evaluation (Addendum)
ANESTHESIA PRE-OPERATIVE EVALUATION    Patient Information    Name: Theresa Pollard    MRN: [id number]    DOB: 09-Feb-1981    Age: 36 year old    Sex: female  * No procedures listed *      Pre-op Vitals:   BP 128/75   Pulse 76   Temp 36.7 C   Resp 18   Ht  (1.397 m)   Wt 89.8 kg (198 lb)   LMP 11/21/2015 (Exact Date)   BMI 46.02 kg/m2   BMI kg/m2: 46.02 kg/m2    Primary language spoken:  English    ROS/Medical History:      General Review & History of Anesthetic Complications:    - negative ROS     Cardiovascular:  - negative ROS       Pulmonary:      Hematology/Oncology:   - negative ROS   Neuro/Psych:   - negative ROS        Infectious Disease:   Negative ROS         Endo/Other:      (+) diabetes mellitus type 2 well controlled,     ROS Comments: BMI 46 GI/Hepatic:  - negative ROS    Renal:    - Negative ROS        Pregnancy History:             Pre Anesthesia Testing (PCC/CPC) notes/comments:                      Last  OSA (STOP BANG) Score:  No Data Recorded    Last OSA  (STOP) Score for   No Data Recorded                 Past Medical History:   Diagnosis Date    Obesity     Prediabetes      Past Surgical History:   Procedure Laterality Date    CARPAL TUNNEL RELEASE      CHOLECYSTECTOMY       Social History   Substance Use Topics    Smoking status: Former Smoker    Smokeless tobacco: Never Used    Alcohol use Not on file       Current Facility-Administered Medications   Medication Dose Route Frequency Provider Last Rate Last Dose    acetaminophen (TYLENOL) tablet 650 mg  650 mg Oral Q6H PRN Sophy, Jonnie Finner, MD        carboprost (HEMABATE) injection 250 mcg  250 mcg IntraMUSCULAR Once PRN Sophy, Jonnie Finner, MD        dextrose 50 % solution 12.5 g  12.5 g IntraVENOUS PRN Sophy, Jonnie Finner, MD        fentaNYL 2 mcg/mL-bupivacaine 0.125% epidural bolus from bag   Epidural PRN Shawn Stall Judithe Modest, MD        fentaNYL 2 mcg/mL-bupivacaine 0.125% epidural infusion   Epidural  Continuous Shawn Stall Judithe Modest, MD        glucagon (GLUCAGON) injection 1 mg  1 mg IntraMUSCULAR Once PRN Sophy, Jonnie Finner, MD        glucose chewable tablet 16 g  4 tablet Oral PRN Sophy, Jonnie Finner, MD        glucose oral gel 1 Tube  1 Tube Oral PRN Sophy, Jonnie Finner, MD        insulin lispro (HUMALOG) injection 0-6 Units  0-6 Units Subcutaneous Q4H NR Sophy, Jonnie Finner, MD  1 Units at 08/20/16 1442    lactated ringers 500 mL IV bolus   IntraVENOUS Once Marylou Mccoy, MD        lactated ringers infusion   IntraVENOUS Continuous Margarita Sermons, MD 75 mL/hr at 08/20/16 1452      lidocaine 1% injection 1 mL  1 mL IntraDERMAL Once PRN Sophy, Jonnie Finner, MD        lidocaine 1% injection 20 mL  20 mL IntraDERMAL Once PRN Sophy, Jonnie Finner, MD        methylergonovine (METHERGINE) injection 0.2 mg  0.2 mg IntraMUSCULAR Once PRN Sophy, Jonnie Finner, MD        misoprostol (CYTOTEC) tablet 25 mcg  25 mcg Vaginal Q4H NR Sophy, Jonnie Finner, MD   25 mcg at 08/20/16 1148    misoprostol (CYTOTEC) tablet 800 mcg  800 mcg Rectal Once PRN Sophy, Jonnie Finner, MD        nalBUPhine (NUBAIN) injection 5 mg  5 mg IntraVENOUS Once PRN Marylou Mccoy, MD        ondansetron [facility]) injection 4 mg  4 mg IntraVENOUS Q6H PRN Sophy, Jonnie Finner, MD        ondansetron [facility]) injection 4 mg  4 mg IntraVENOUS Once PRN Marylou Mccoy, MD        oxytocin (PITOCIN) 10 units/500 mL LR infusion  0-16 milli-units/min IntraVENOUS Continuous Carin Primrose, MD 12 mL/hr at 08/20/16 2100 4 milli-units/min at 08/20/16 2100    oxytocin (PITOCIN) 20 Units in lactated ringers 1000 mL infusion   IntraVENOUS Continuous PRN Sophy, Jonnie Finner, MD        oxytocin (PITOCIN) 40 Units in 0.9 % sodium chloride 1000 mL infusion   IntraVENOUS Once PRN Sophy, Jonnie Finner, MD        oxytocin (PITOCIN) IM injection 10 Units  10 Units  IntraMUSCULAR Once PRN Sophy, Jonnie Finner, MD        sodium chloride (PF) 0.9 % flush 3 mL  3 mL IntraVENOUS Q8H Sophy, Jonnie Finner, MD        sodium chloride (PF) 0.9 % flush 3 mL  3 mL IntraVENOUS PRN Sophy, Jonnie Finner, MD        sodium chloride 0.9 % TKO infusion   IntraVENOUS Continuous PRN Sophy, Jonnie Finner, MD        sodium citrate-citric acid (BICITRA) 500-334 MG/5ML solution 30 mL  30 mL Oral Once PRN Sophy, Jonnie Finner, MD        terbutaline (BRETHINE) injection 0.25 mg  0.25 mg Subcutaneous Q20 Min PRN Sophy, Jonnie Finner, MD         No Known Allergies    Labs and Other Data  Lab Results   Component Value Date    BUN 4 03/24/2016    CREAT 0.31 03/24/2016     Lab Results   Component Value Date    AST 17 03/24/2016    ALT 12 03/24/2016     Lab Results   Component Value Date    WBC 7.7 08/20/2016    RBC 4.11 08/20/2016    HGB 12.1 08/20/2016    HCT 36.5 08/20/2016    MCV 88.8 08/20/2016    MCHC 33.2 08/20/2016    RDW 14.5 08/20/2016    PLT 183 08/20/2016    MPV 12.4 08/20/2016    SEG 69 07/17/2016    LYMPHS 22 07/17/2016    MONOS 8 07/17/2016    EOS 1 07/17/2016  No results found for: INR, PTT  No results found for: ARTPH, ARTPO2, ARTPCO2    Anesthesia Plan:  Risks and Benefits of Anesthesia  I personally examined the patient immediately prior to the anesthetic and reviewed the pertinent medical history, drug and allergy history, laboratory and imaging studies and consultations. I have determined that the patient has had adequate assessment and testing.    Anesthetic techniques, invasive monitors, anesthetic drugs for induction, maintenance and post-operative analgesia, risks and alternatives have been explained to the patient and/or patient's representatives.    I have prescribed the anesthetic plan:         Planned anesthesia method: Epidural         ASA 3 (Severe systemic disease)     Potential anesthesia problems identified and risks including but not limited to the  following were discussed with patient and/or patient's representative: Adverse or allergic drug reaction and Injury to brain, heart and other organs    Planned monitoring method: Routine monitoring    Informed Consent:  Anesthetic plan and risks discussed with Patient.

## 2016-08-20 NOTE — Interdisciplinary (Signed)
Dr Alphonzo Lemmings at bedside for SVE (1-2/50/-3). Per MD, not starting Pitocin at this time. Plan is to recheck in 4 hours. Will consider FB if pt has not made change.

## 2016-08-21 ENCOUNTER — Encounter (HOSPITAL_COMMUNITY): Payer: Self-pay

## 2016-08-21 ENCOUNTER — Encounter (HOSPITAL_COMMUNITY): Admission: AD | Disposition: A | Discharge status: Home Health | Payer: Self-pay | Attending: Obstetrics & Gynecology

## 2016-08-21 ENCOUNTER — Other Ambulatory Visit (HOSPITAL_BASED_OUTPATIENT_CLINIC_OR_DEPARTMENT_OTHER): Payer: Medicaid Other

## 2016-08-21 DIAGNOSIS — Z302 Encounter for sterilization: Secondary | ICD-10-CM

## 2016-08-21 DIAGNOSIS — O09523 Supervision of elderly multigravida, third trimester: Secondary | ICD-10-CM

## 2016-08-21 DIAGNOSIS — O24424 Gestational diabetes mellitus in childbirth, insulin controlled: Secondary | ICD-10-CM

## 2016-08-21 DIAGNOSIS — R000 Tachycardia, unspecified: Secondary | ICD-10-CM

## 2016-08-21 DIAGNOSIS — O24414 Gestational diabetes mellitus in pregnancy, insulin controlled: Secondary | ICD-10-CM

## 2016-08-21 DIAGNOSIS — Z3A39 39 weeks gestation of pregnancy: Secondary | ICD-10-CM

## 2016-08-21 LAB — GLUCOSE (POCT)
Glucose (POCT): 90 mg/dL (ref 70–99)
Glucose (POCT): 93 mg/dL (ref 70–99)
Glucose (POCT): 109 mg/dL — ABNORMAL HIGH (ref 70–99)
Glucose (POCT): 132 mg/dL — ABNORMAL HIGH (ref 70–99)
Glucose (POCT): 129 mg/dL — ABNORMAL HIGH (ref 70–99)
Glucose (POCT): 147 mg/dL — ABNORMAL HIGH (ref 70–99)
Glucose (POCT): 148 mg/dL — ABNORMAL HIGH (ref 70–99)

## 2016-08-21 LAB — CBC WITH DIFF, BLOOD
ANC-Automated: 18.8 10*3/uL — ABNORMAL HIGH (ref 1.6–7.0)
Abs Basophils: 0.1 10*3/uL (ref <0.1)
Abs Lymphs: 1.6 10*3/uL (ref 0.8–3.1)
Abs Monos: 1.2 10*3/uL — ABNORMAL HIGH (ref 0.2–0.8)
Hct: 32.9 % — ABNORMAL LOW (ref 34.0–45.0)
Hgb: 10.8 gm/dL — ABNORMAL LOW (ref 11.2–15.7)
Imm Gran Abs: 0.2 10*3/uL — ABNORMAL HIGH (ref <0.1)
Lymphocytes: 8 %
MCH: 29.3 pg (ref 26.0–32.0)
MCHC: 32.8 g/dL (ref 32.0–36.0)
MCV: 89.4 um3 (ref 79.0–95.0)
MPV: 12.1 fL (ref 9.4–12.4)
Monocytes: 6 %
Plt Count: 187 10*3/uL (ref 140–370)
RBC: 3.68 10*6/uL — ABNORMAL LOW (ref 3.90–5.20)
RDW: 14.5 % — ABNORMAL HIGH (ref 12.0–14.0)
Segs: 86 %
WBC: 21.9 10*3/uL — ABNORMAL HIGH (ref 4.0–10.0)

## 2016-08-21 LAB — PROTHROMBIN TIME, BLOOD
INR: 1
INR: 1
PT,Patient: 10.9 s (ref 9.7–12.5)
PT,Patient: 10.4 s (ref 9.7–12.5)

## 2016-08-21 LAB — BASIC METABOLIC PANEL, BLOOD
Anion Gap: 14 mmol/L (ref 7–15)
BUN: 10 mg/dL (ref 6–20)
Bicarbonate: 20 mmol/L — ABNORMAL LOW (ref 22–29)
Calcium: 8.1 mg/dL — ABNORMAL LOW (ref 8.5–10.6)
Chloride: 103 mmol/L (ref 98–107)
Creatinine: 0.6 mg/dL (ref 0.51–0.95)
GFR: >60 mL/min
Glucose: 144 mg/dL — ABNORMAL HIGH (ref 70–99)
Potassium: 4.4 mmol/L (ref 3.5–5.1)
Sodium: 137 mmol/L (ref 136–145)

## 2016-08-21 LAB — HEMOGRAM, BLOOD
Hct: 34.1 % (ref 34.0–45.0)
Hgb: 11.6 gm/dL (ref 11.2–15.7)
MCH: 30.5 pg (ref 26.0–32.0)
MCHC: 34 g/dL (ref 32.0–36.0)
MCV: 89.7 um3 (ref 79.0–95.0)
MPV: 12.1 fL (ref 9.4–12.4)
Plt Count: 179 10*3/uL (ref 140–370)
RBC: 3.8 10*6/uL — ABNORMAL LOW (ref 3.90–5.20)
RDW: 14.7 % — ABNORMAL HIGH (ref 12.0–14.0)
WBC: 23.2 10*3/uL — ABNORMAL HIGH (ref 4.0–10.0)

## 2016-08-21 LAB — KLEIHAUER BETKE, BLOOD: Kleihauer Betke: NEGATIVE

## 2016-08-21 LAB — FIBRINOGEN, BLOOD
Fibrinogen: 537 mg/dL — ABNORMAL HIGH (ref 200–450)
Fibrinogen: 647 mg/dL — ABNORMAL HIGH (ref 200–450)

## 2016-08-21 LAB — APTT, BLOOD
PTT: 33 s (ref 25–34)
PTT: 32 s (ref 25–34)

## 2016-08-21 SURGERY — Surgical Case
Wound class: Class II (Clean Contaminated)

## 2016-08-21 MED ORDER — METHYLERGONOVINE MALEATE 0.2 MG/ML IJ SOLN
INTRAMUSCULAR | Status: DC | PRN
Start: 2016-08-21 — End: 2016-08-21
  Administered 2016-08-21: 0.2 mg via INTRAMUSCULAR

## 2016-08-21 MED ORDER — HYDROMORPHONE HCL 1 MG/ML IJ SOLN
0.5000 mg | INTRAMUSCULAR | Status: DC | PRN
Start: 2016-08-21 — End: 2016-08-21
  Administered 2016-08-21: 0.5 mg via INTRAVENOUS
  Filled 2016-08-21: qty 0.5

## 2016-08-21 MED ORDER — ROCURONIUM BROMIDE 100 MG/10ML IV SOLN
INTRAVENOUS | Status: DC | PRN
Start: 2016-08-21 — End: 2016-08-21
  Administered 2016-08-21: 20 mg via INTRAVENOUS

## 2016-08-21 MED ORDER — TRANEXAMIC ACID 1000 MG/10ML IV SOLN
1000.0000 mg | INTRAVENOUS | Status: DC | PRN
Start: 2016-08-21 — End: 2016-08-21
  Administered 2016-08-21: 1000 mg via INTRAVENOUS

## 2016-08-21 MED ORDER — FAMOTIDINE 20 MG/2ML IV SOLN
INTRAVENOUS | Status: DC | PRN
Start: 2016-08-21 — End: 2016-08-21
  Administered 2016-08-21: 20 mg via INTRAVENOUS

## 2016-08-21 MED ORDER — FENTANYL CITRATE (PF) 100 MCG/2ML IJ SOLN
25.0000 ug | INTRAMUSCULAR | Status: DC | PRN
Start: 2016-08-21 — End: 2016-08-21

## 2016-08-21 MED ORDER — METOCLOPRAMIDE HCL 5 MG/ML IJ SOLN
INTRAMUSCULAR | Status: DC | PRN
Start: 2016-08-21 — End: 2016-08-21
  Administered 2016-08-21: 10 mg via INTRAVENOUS

## 2016-08-21 MED ORDER — DIPHENHYDRAMINE HCL 25 MG OR TABS OR CAPS CUSTOM
25.0000 mg | ORAL_CAPSULE | Freq: Four times a day (QID) | ORAL | Status: DC | PRN
Start: 2016-08-21 — End: 2016-08-25

## 2016-08-21 MED ORDER — RHO D IMMUNE GLOBULIN 1500 UNITS IM SOSY
300.0000 ug | PREFILLED_SYRINGE | Freq: Once | INTRAMUSCULAR | Status: AC
Start: 2016-08-21 — End: 2016-08-22
  Administered 2016-08-22: 300 ug via INTRAMUSCULAR

## 2016-08-21 MED ORDER — CEFAZOLIN SODIUM 1 GM IJ SOLR
INTRAMUSCULAR | Status: DC | PRN
Start: 2016-08-21 — End: 2016-08-21
  Administered 2016-08-21: 2000 mg via INTRAVENOUS

## 2016-08-21 MED ORDER — CLINDAMYCIN PHOSPHATE IN D5W 900 MG/50ML IV SOLN
900.0000 mg | Freq: Once | INTRAVENOUS | Status: AC
Start: 2016-08-21 — End: 2016-08-21
  Administered 2016-08-21: 900 mg via INTRAVENOUS
  Filled 2016-08-21: qty 50

## 2016-08-21 MED ORDER — OXYCODONE HCL 5 MG OR TABS
5.0000 mg | ORAL_TABLET | ORAL | Status: DC | PRN
Start: 2016-08-21 — End: 2016-08-25
  Administered 2016-08-24 (×5): 5 mg via ORAL
  Filled 2016-08-21 (×6): qty 1

## 2016-08-21 MED ORDER — OXYTOCIN INFUSION 20 UNITS/1000 ML LR (PREMIX)
Status: AC
Start: 2016-08-21 — End: 2016-08-22
  Administered 2016-08-21: 125 mL/h via INTRAVENOUS
  Filled 2016-08-21: qty 1000

## 2016-08-21 MED ORDER — DOCUSATE SODIUM 250 MG OR CAPS
250.0000 mg | ORAL_CAPSULE | Freq: Two times a day (BID) | ORAL | Status: DC
Start: 2016-08-21 — End: 2016-08-25
  Administered 2016-08-21 – 2016-08-25 (×8): 250 mg via ORAL
  Filled 2016-08-21 (×8): qty 1

## 2016-08-21 MED ORDER — OXYCODONE HCL 10 MG OR TABS
10.0000 mg | ORAL_TABLET | ORAL | Status: DC | PRN
Start: 2016-08-21 — End: 2016-08-25
  Administered 2016-08-21 – 2016-08-25 (×10): 10 mg via ORAL
  Filled 2016-08-21 (×10): qty 1

## 2016-08-21 MED ORDER — DIPHENHYDRAMINE HCL 50 MG/ML IJ SOLN
12.5000 mg | Freq: Once | INTRAMUSCULAR | Status: DC | PRN
Start: 2016-08-21 — End: 2016-08-21

## 2016-08-21 MED ORDER — NALOXONE HCL 0.4 MG/ML IJ SOLN
0.1000 mg | INTRAMUSCULAR | Status: DC | PRN
Start: 2016-08-21 — End: 2016-08-25

## 2016-08-21 MED ORDER — MORPHINE SULFATE 15 MG/ML IJ SOLN
INTRAMUSCULAR | Status: DC | PRN
Start: 2016-08-21 — End: 2016-08-21
  Administered 2016-08-21: 3 mg via INTRAVENOUS

## 2016-08-21 MED ORDER — OXYCODONE HCL 5 MG OR TABS
5.0000 mg | ORAL_TABLET | ORAL | Status: DC | PRN
Start: 2016-08-21 — End: 2016-08-25
  Administered 2016-08-22 – 2016-08-23 (×4): 5 mg via ORAL
  Filled 2016-08-21 (×3): qty 1

## 2016-08-21 MED ORDER — SODIUM CHLORIDE 0.9 % IV SOLN
5.0000 mg/kg | Freq: Once | INTRAVENOUS | Status: AC
Start: 2016-08-21 — End: 2016-08-21
  Administered 2016-08-21: 450 mg via INTRAVENOUS
  Filled 2016-08-21: qty 11.25

## 2016-08-21 MED ORDER — SIMETHICONE 80 MG OR CHEW
80.0000 mg | CHEWABLE_TABLET | Freq: Three times a day (TID) | ORAL | Status: DC | PRN
Start: 2016-08-21 — End: 2016-08-25
  Administered 2016-08-21 – 2016-08-25 (×9): 80 mg via ORAL
  Filled 2016-08-21 (×9): qty 1

## 2016-08-21 MED ORDER — LACTATED RINGERS IV SOLN
INTRAVENOUS | Status: AC
Start: 2016-08-21 — End: 2016-08-21
  Administered 2016-08-21: 10:00:00 via INTRAVENOUS

## 2016-08-21 MED ORDER — MEPERIDINE HCL 25 MG/ML IJ SOLN
25.0000 mg | INTRAMUSCULAR | Status: DC | PRN
Start: 2016-08-21 — End: 2016-08-21

## 2016-08-21 MED ORDER — MORPHINE SULFATE (PF) 1 MG/ML IJ SOLN
INTRAMUSCULAR | Status: DC | PRN
Start: 2016-08-21 — End: 2016-08-21
  Administered 2016-08-21: 2 mg via EPIDURAL

## 2016-08-21 MED ORDER — HYDROMORPHONE HCL 1 MG/ML IJ SOLN
INTRAMUSCULAR | Status: DC | PRN
Start: 2016-08-21 — End: 2016-08-21
  Administered 2016-08-21 (×2): 1 mg via INTRAVENOUS

## 2016-08-21 MED ORDER — ONDANSETRON HCL 4 MG/2ML IV SOLN
INTRAMUSCULAR | Status: DC | PRN
Start: 2016-08-21 — End: 2016-08-21
  Administered 2016-08-21: 4 mg via INTRAVENOUS

## 2016-08-21 MED ORDER — MIDAZOLAM HCL 2 MG/2ML IJ SOLN
INTRAMUSCULAR | Status: DC | PRN
Start: 2016-08-21 — End: 2016-08-21

## 2016-08-21 MED ORDER — PHENYLEPHRINE DILUTION 100 MCG/ML IJ SOLN
INTRAVENOUS | Status: DC | PRN
Start: 2016-08-21 — End: 2016-08-21
  Administered 2016-08-21: 100 ug via INTRAVENOUS
  Administered 2016-08-21: 50 ug via INTRAVENOUS

## 2016-08-21 MED ORDER — CLINDAMYCIN PHOSPHATE IN D5W 600 MG/50ML IV SOLN
600.0000 mg | Freq: Once | INTRAVENOUS | Status: DC
Start: 2016-08-21 — End: 2016-08-21

## 2016-08-21 MED ORDER — MAGNESIUM HYDROXIDE 400 MG/5ML OR SUSP
30.0000 mL | Freq: Every evening | ORAL | Status: DC | PRN
Start: 2016-08-21 — End: 2016-08-25

## 2016-08-21 MED ORDER — LACTATED RINGERS IV SOLN
INTRAVENOUS | Status: DC
Start: 2016-08-21 — End: 2016-08-21

## 2016-08-21 MED ORDER — SUCCINYLCHOLINE CHLORIDE 20 MG/ML IJ SOLN
INTRAMUSCULAR | Status: DC | PRN
Start: 2016-08-21 — End: 2016-08-21
  Administered 2016-08-21: 80 mg via INTRAVENOUS

## 2016-08-21 MED ORDER — LACTATED RINGERS IV SOLN
INTRAVENOUS | Status: DC | PRN
Start: 2016-08-21 — End: 2016-08-21
  Administered 2016-08-21: 12:00:00 via INTRAVENOUS

## 2016-08-21 MED ORDER — PROPOFOL 200 MG/20ML IV EMUL
INTRAVENOUS | Status: DC | PRN
Start: 2016-08-21 — End: 2016-08-21
  Administered 2016-08-21 (×3): 40 mg via INTRAVENOUS
  Administered 2016-08-21: 20 mg via INTRAVENOUS
  Administered 2016-08-21: 40 mg via INTRAVENOUS
  Administered 2016-08-21: 150 mg via INTRAVENOUS
  Administered 2016-08-21: 40 mg via INTRAVENOUS

## 2016-08-21 MED ORDER — INSULIN LISPRO (HUMAN) 100 UNIT/ML SC SOLN (CUSTOM)
0.0000 [IU] | Freq: Four times a day (QID) | INTRAMUSCULAR | Status: DC
Start: 2016-08-21 — End: 2016-08-25
  Administered 2016-08-21 (×2): 1 [IU] via SUBCUTANEOUS
  Administered 2016-08-22: 2 [IU] via SUBCUTANEOUS
  Administered 2016-08-22: 1 [IU] via SUBCUTANEOUS
  Administered 2016-08-22: 2 [IU] via SUBCUTANEOUS
  Administered 2016-08-22: 1 [IU] via SUBCUTANEOUS
  Administered 2016-08-23 (×2): 2 [IU] via SUBCUTANEOUS
  Administered 2016-08-23: 1 [IU] via SUBCUTANEOUS
  Administered 2016-08-23: 2 [IU] via SUBCUTANEOUS
  Administered 2016-08-24: 1 [IU] via SUBCUTANEOUS
  Administered 2016-08-24: 2 [IU] via SUBCUTANEOUS
  Administered 2016-08-24: 1 [IU] via SUBCUTANEOUS
  Administered 2016-08-24: 2 [IU] via SUBCUTANEOUS
  Administered 2016-08-25: 1 [IU] via SUBCUTANEOUS
  Administered 2016-08-25: 3 [IU] via SUBCUTANEOUS
  Filled 2016-08-21: qty 2
  Filled 2016-08-21 (×4): qty 1
  Filled 2016-08-21: qty 2
  Filled 2016-08-21: qty 3
  Filled 2016-08-21: qty 1
  Filled 2016-08-21 (×3): qty 2
  Filled 2016-08-21: qty 1
  Filled 2016-08-21 (×2): qty 2
  Filled 2016-08-21 (×2): qty 1

## 2016-08-21 MED ORDER — NEOSTIGMINE METHYLSULFATE 10 MG/10ML IV SOLN
INTRAVENOUS | Status: DC | PRN
Start: 2016-08-21 — End: 2016-08-21
  Administered 2016-08-21: 3 mg via INTRAVENOUS

## 2016-08-21 MED ORDER — SODIUM CHLORIDE 0.9 % IV SOLN
500.0000 mg | Freq: Once | INTRAVENOUS | Status: AC
Start: 2016-08-21 — End: 2016-08-21
  Administered 2016-08-21: 500 mg via INTRAVENOUS
  Filled 2016-08-21: qty 500

## 2016-08-21 MED ORDER — GLYCOPYRROLATE 1 MG/5ML IJ SOLN
INTRAMUSCULAR | Status: DC | PRN
Start: 2016-08-21 — End: 2016-08-21
  Administered 2016-08-21: .5 mg via INTRAVENOUS

## 2016-08-21 MED ORDER — MEASLES, MUMPS & RUBELLA VAC SC INJ
0.5000 mL | INJECTION | SUBCUTANEOUS | Status: AC
Start: 2016-08-21 — End: 2016-08-23
  Administered 2016-08-23: 0.5 mL via SUBCUTANEOUS
  Filled 2016-08-21: qty 0.5

## 2016-08-21 MED ORDER — ACETAMINOPHEN 325 MG PO TABS
650.0000 mg | ORAL_TABLET | Freq: Four times a day (QID) | ORAL | Status: DC | PRN
Start: 2016-08-21 — End: 2016-08-25
  Administered 2016-08-21 – 2016-08-24 (×8): 650 mg via ORAL
  Filled 2016-08-21 (×8): qty 2

## 2016-08-21 MED ORDER — FENTANYL CITRATE (PF) 100 MCG/2ML IJ SOLN
INTRAMUSCULAR | Status: DC | PRN
Start: 2016-08-21 — End: 2016-08-21
  Administered 2016-08-21: 100 ug via EPIDURAL

## 2016-08-21 MED ORDER — FENTANYL CITRATE (PF) 250 MCG/5ML IJ SOLN
INTRAMUSCULAR | Status: DC | PRN
Start: 2016-08-21 — End: 2016-08-21
  Administered 2016-08-21: 100 ug via INTRAVENOUS

## 2016-08-21 MED ORDER — ONDANSETRON HCL 4 MG/2ML IV SOLN
4.0000 mg | Freq: Once | INTRAMUSCULAR | Status: DC | PRN
Start: 2016-08-21 — End: 2016-08-21

## 2016-08-21 MED ORDER — TETANUS-DIPHTH-ACELL PERTUSSIS 5-2.5-18.5 LF-MCG/0.5 IM SUSP
0.5000 mL | INTRAMUSCULAR | Status: DC
Start: 2016-08-21 — End: 2016-08-25

## 2016-08-21 MED ORDER — LEVONORGESTREL 20 MCG/24HR IU IUD
1.0000 | INTRAUTERINE_SYSTEM | Freq: Once | INTRAUTERINE | Status: DC
Start: 2016-08-21 — End: 2016-08-21
  Filled 2016-08-21: qty 1

## 2016-08-21 MED ORDER — LIDOCAINE HCL 2 % IJ SOLN
INTRAMUSCULAR | Status: DC | PRN
Start: 2016-08-21 — End: 2016-08-21
  Administered 2016-08-21: 5 mL via EPIDURAL
  Administered 2016-08-21: 2 mL via EPIDURAL
  Administered 2016-08-21: 5 mL via EPIDURAL
  Administered 2016-08-21: 3 mL via EPIDURAL

## 2016-08-21 MED ORDER — SODIUM CHLORIDE 0.9 % IV SOLN
12.5000 mg | Freq: Once | INTRAVENOUS | Status: DC | PRN
Start: 2016-08-21 — End: 2016-08-21

## 2016-08-21 SURGICAL SUPPLY — 54 items
APPLICATOR CHLORAPREP 26ML, ~~LOC~~ (Misc Medical Supply) ×2 IMPLANT
BAG INFANT RESUSCITATION DISP (Anesthesia Supply) ×2 IMPLANT
BENZOIN TINCTURE AMPULE STERILE (Misc Medical Supply) ×2 IMPLANT
BLADE SURGEON #10 STERILE (Knives/Blades) ×2
BLADE SURGEON #15 STERILE (Knives/Blades) ×2
CANISTER SUCTION 1200CC (Misc Medical Supply) ×2 IMPLANT
CLAMP CORD SINGLE STERILE (Misc Medical Supply) ×2 IMPLANT
CLIPPER BLADE ASSY FOR 9670 CLIPPER (Knives/Blades) ×2 IMPLANT
CONTAINER PATH 64 OZ W/LID (Misc Medical Supply) ×2
COVER LIGHT HANDLE RIGID (2 PACK) (Misc Surgical Supply) ×2 IMPLANT
DRAPE HALF SHEET MEDIUM (Drapes/towels) ×2
DRESSING MEPILEX BORDER SILICONE 4" X 10" (Dressings/packing) ×2 IMPLANT
DRESSING SPONGE SOFT 4X4 (Dressings/packing) IMPLANT
DRESSING TELFA STRL 3" X 8" (Dressings/packing)
GLOVE BIOGEL INDICATOR UNDERGLOVE SIZE 6.5 (Gloves/gowns) ×4 IMPLANT
GLOVE BIOGEL INDICATOR UNDERGLOVE SIZE 7 (Gloves/gowns) ×8 IMPLANT
GLOVE BIOGEL PI ULTRATOUCH SIZE 6.5 (Gloves/gowns) ×2 IMPLANT
GLOVE BIOGEL PI ULTRATOUCH SIZE 8 (Gloves/gowns) ×2
GLOVE SURGEON BIOGEL SIZE 7 (Gloves/gowns) IMPLANT
GLOVE SURGICAL BIOGEL SIZE 6 (Gloves/gowns) ×2
GLOVE SURGICAL BIOGEL SIZE 6.5 (Gloves/gowns) ×14
GOWN SURGICAL ULTRA FABRIC- REINFORCED, XL BLUE (Gloves/gowns) ×6
HOVERMATT TRANSFER MATTRESS 39 FULL -SINGLE PATIENT USE (Patient Care Supply) IMPLANT
KIT BLD GAS 3CC 22GX1 SAFETY (Kits/Sets/Trays) ×2 IMPLANT
MANIFOLD 4 PORT STANDARD (Misc Medical Supply) ×1
MANIFOLD 4 PORT STANDARD, NEPTUNE SUCTION (Misc Medical Supply) ×1
MASK INFANT SEALFLEX (Anesthesia Supply) ×2
PACK C-SECTN BIRTH W/DRAPE (Procedure Packs/kits) ×2
PAD GROUND VALLEYLAB REM ADULT E7507 (Misc Surgical Supply) ×4 IMPLANT
PEN MARKER II SECURLINE BLACK (Misc Medical Supply) ×2 IMPLANT
PENCIL ELECTROSURG W/BL&CTRL (Cautery) ×2 IMPLANT
PREP TRAY WET SKIN SCRUB KENDALL (Misc Surgical Supply) ×2 IMPLANT
SKIN PREP -BATH CLOTH CHG 2% (Misc Medical Supply) ×2 IMPLANT
SLEEVE SCD KNEE LG (Misc Medical Supply) IMPLANT
SLEEVE SCD KNEE MEDIUM (Misc Medical Supply) ×2 IMPLANT
SOLUTION IRR POUR BTL 0.9% NS 1000ML (Non-Pharmacy Meds/Solutions) ×8 IMPLANT
SOLUTION IRR POUR BTL H20 1000ML (Non-Pharmacy Meds/Solutions) ×2
SPONGE LAP RF DETECT 18" X 18" XRAY STERILE (Dressings/packing) ×16
STAPLER PROXIMATE SKIN 35 WIDE (Staplers and staple reloads)
STRIP MEDI-STRIP SKIN CLOSURE 1/2 X 4" (Dressings/packing) ×1
STRIP SKIN CLOSURE 1/2 X 4 (Dressings/packing) ×1
SUTURE CHROMIC GUT 0 36" CT 914 (Suture)
SUTURE CHROMIC GUT 4-0 27" SH G121 (Suture) ×2
SUTURE MONOCRYL PLUS 4-0 27" PS-2 MCP426 (Suture) ×2 IMPLANT
SUTURE PLAIN GUT 0 54" (Suture) ×2
SUTURE VICRYL 2-0 CT-1 36" J945 (Suture) ×10 IMPLANT
SUTURE VICRYL 4-0 27" KS (Suture)
SUTURE VICRYL PLUS 0 36" CT-1 VCP946 (Suture) ×4 IMPLANT
SUTURE VICRYL PLUS 0 36" CTX-B VCPB978 (Suture) ×8
SUTURE VICRYL PLUS 2-0 27" CT-1 VCPP42D (Suture)
SUTURE VICRYL PLUS 3-0 27" SH VCP416 (Suture) ×2
TOWEL BLUE STERILE DISPOSABLE (Drapes/towels) IMPLANT
TOWELS OR BLUE 4-PACK STERILE, DISPOSABLE (Drapes/towels) ×4 IMPLANT
TRAY FOLEY SURESTEP LUBRI-SIL I.C.16FR URIMETER, LF (Lines/Drains) ×2

## 2016-08-21 NOTE — Progress Notes (Signed)
Labor Progress Note    S: Comfortable with CLE     O:    08/20/16  2344 08/20/16  2349 08/20/16  2354 08/21/16  0000   BP:       Pulse: 83 88     Resp:    18   Temp:   98.3 F (36.8 C)    SpO2: 98% 97%       SVE: 4/50/-3, very anterior/tucked behind pubic bone  FHT: 130-140's, mod variability, + accelerations, no decelerations  TOCO: q 2-4 minutes    A/P Theresa Pollard is a 36 year old at [id number] admitted for IOL GDMA2 vs T2DM  #. Labor- Making change in latent labor s/p FB and now on pitocin. Continue pitocin per protocol. AROM deferred at this time 2/2 high fetal station  #. FWB- Cat 1  #. Pain - CLE  #. GDM vs T2DM - last BG 96, wnl  #. Dispo - towards SVD    [name], MD  Signature Derived From Controlled Access Password, August 21, 2016, 1:20 AM

## 2016-08-21 NOTE — Progress Notes (Signed)
Labor Progress Note    S: Comfortable with CLE     O:    08/21/16  0512 08/21/16  0543 08/21/16  0600 08/21/16  0607   BP: 110/64 101/62 113/64    Pulse: 86 83 80 89   Resp:   18    Temp:   98.7 F (37.1 C)    SpO2:   98% 94%     SVE: 5-6/70/-2, very anterior  FHT: 140's, mod variability, + accelerations, small variable decelerations  TOCO: q 2-4 minutes, intermittently not tracing  BOW: AROM 0505  Pit @ 14    A/P Theresa Pollard is a 36 year old at [id number] admitted for IOL GDMA2 vs T2DM  #. Labor- Good cervical change since last check s/p AROM. Suspect pt transitioning into active labor. Cont pit per protocol  #. FWB- Cat 2, frequent small variable decels, now improving with intrauterine resuscitative measures. Overall reassuring   #. Pain - CLE  #. GDM vs T2DM - last BG 90, wnl  #. Dispo - towards SVD    [name], MD  Signature Derived From Controlled Access Password, August 21, 2016, 6:39 AM

## 2016-08-21 NOTE — Op Note (Signed)
C-SECTION OPERATIVE NOTE  DATE: 08/21/2016  TIME: 2:38 PM    PREOPERATIVE DIAGNOSIS: 1) IUP 39 1/7 weeks 2) Arrest of Descent at 0 station, category 2 tracing 3) GDM, insulin 4) Desires permanent sterilization     POSTOPERATIVE DIAGNOSIS: same and atony, PPH, left firm hemorrhagic cyst suspected endometrioma - after cystectomy hemorrhagic CL    PROCEDURE INFORMATION:  Procedure(s):  DELIVERY, CESAREAN    ATTENDING SURGEON:   Surgeon(s) and Role:     * Bryssa Tones, Lynann Beaver, MD - Primary     * Wendee Beavers, MD - Resident - Assisting     * Margarita Sermons, MD - Resident - Assisting    ANESTHESIA: CLE converted to GETA    FINDINGS:  Vertex female, ROT, Apgars pending, Weight 7# 10oz    SPECIMENS: Placenta and tubal segments and left ovarian cyst    Fluids/Blood Products:      IV Fluids: LR    Blood Products: none    EBL: QBL 2 liters    Urine Output:    COMPLICATIONS: none    DISPOSITION: to PACU    INDICATIONS: Patient is 36 year old G5P5005 at [redacted]w[redacted]d admitted for induction of labor for GDMA2 on insulin. She progressed to complete but was noted to still have a high fetal station with no descent past 0 station after 1.5 hours of pushing and a category II tracing that was concerning. Given this, she was recommended a primary cesarean for arrest of descent.     DESCRIPTION OF PROCEDURE: The patient was taken to the operating room where her CLE anesthesia was placedand found to be adequate. The patient was prepped and draped in the normal sterile fashion in the dorsal supine position with a leftward tilt. Adequacy of anesthesia was tested and confirmed with an Allis clamp.    A Pfannenstiel skin incision was then made with the scalpel and carried through to the underlying layer of fascia. The fascia was incised in the midline and the incision was extended laterally with the Mayo scissors. The inferior aspect of the fascial incision was then grasped with the Kocher clamps, elevated, and the  underlying rectus muscles were dissected off with the Mayo scissors. Attention was then turned to the superior aspect of this incision, which, in a similar fashion, was grasped, tented up with the Kocher clamps, and the rectus muscles were dissected off with Mayo scissors.    The rectus muscles were then separated in the midline and the peritoneum was identified and entered bluntly. The peritoneal incision was then extended superiorly and inferiorly with good visualization of the bladder using stretch. With manipulation of the peritoneum the patient began experiencing very poor pain control necessitating conversion to general anesthesia and intubation. The bladder blade was then inserted and the vesicouterine peritoneum was identified, grasped with the pickups, and entered sharply with Metzenbaum scissors. The incision was then extended laterally and a bladder flap was created digitally.    The bladder blade was then reinserted, and the lower uterine segment was incised in a transverse fashion with the scalpel. The uterine incision was then extended laterally using stretch in the cephalad and caudad direction. The bladder blade was removed, and the infant's head and body were delivered atraumatically. Cord clamped and cut, and the infant was handed off to the waiting pediatricians. Cord gases were sent.    The placenta was then removed manually. The uterus was exteriorized and cleared of all clot and debris. Uterine tone was noted to  be boggy and 40 units of pitocin were given. The uterine incision was repaired with 0-vicryl in a running, locked fashion.A left sided extension was noted to have brisk bleeding from several vessels requiring additional hemostasis was placement of additional 0-vicryl sutures in figure of eights. A second layer of 0-vicryl suture was used to create an imbricating layer.     Attention was then turned to the bilateral tubal ligation portion of the procedure.  The right fallopian tube was  identified and grasped with the Babcock clamp.  The tube was followed out to the fimbria.  The Babcock clamp was then used to grasp the tube approximately 4cm from the cornual region.  A window in the mesosalpinx was then made using Bovie cautery.  A 3cm segment of tube was then isolated and ligated with two free ties of plain gut and excised with the Metzenbaum scissors.  Good hemostasis was noted.  The left fallopian tube was then identified, in a similar fashion, and a 3 cm segment of tube was excised.  Excellent hemostasis was noted.      The ovaries were then examined and the left ovary was notable for a 2 cm left firm hemorrhagic cyst that was suspected to be an endometrioma. The capsule was incised and the cyst was shelled out, grasped with a babock and removed. After removal the cystic bed was found to have an appearance more consistent with a corpus luteum. Hemostasis over the ovary was obtained with bovie cautery, a running suture of 3-0 vicryl, and a figure of eight suture of 4-0 chromic.     The uterus was then returned to the abdomen. The uterine incision was then reinspected and found to be hemostatic. The previously suture ligated fallopian tube segments and left ovary were examined and confirmed to be hemostatic.     The fascia was reapproximated with an 0-vicryl in a running fashion. This layer was then irrigated using normal saline and made hemostatic using the Bovie cautery.Two layers of running subcutaneous suture were used to reapproximate >2cm of subcutaneous tissue.  The skin was then closed with subcuticular sutures using 4-0 monocryl.    The patient tolerated the procedure well. Sponge, lap, and needle counts were correct x3 and radiofrequency scanning was correct x2.   2g of Ancef and 500 gm azithromycin was given prior to skin incision. The patient was taken to the recovery room in stable condition.    Dr. Sheral Apley was present, scrubbed, and attending throughout the entire  procedure.    [place] [id number]  Obstetrics and Gynecology  PGY-4    08/21/2016 5:33 PM      I was present throughout, reviewed and agree.  Synthia Innocent, MD

## 2016-08-21 NOTE — Interdisciplinary (Signed)
Pt admitted to PACU and placed on monitoring. Report received from Anesthesia, and pt assessed with repositioning and linen change. Dual fundal check performed with OB RN.

## 2016-08-21 NOTE — Plan of Care (Signed)
Problem: Falls, Risk of  Goal: Keep patient free from falls utilizing universal fall precautions  Outcome: Progressing toward goal, anticipate improvement over: next 12-24 hours  Pt's call light with in reach    Problem: Anxiety  Goal: Alleviation of anxiety  Outcome: Progressing toward goal, anticipate improvement over: next 12-24 hours  Pt denies being anxious, no family support at bs    Problem: Fluid Volume Imbalance, Risk of  Goal: Absence of intrapartum hemorrhage signs and symptoms  Outcome: Progressing toward goal, anticipate improvement over: next 12-24 hours  No vaginal bleeding noted at this time  Goal: Balanced intake and output  Outcome: Progressing toward goal, anticipate improvement over: next 12-24 hours  Pt making adequate urine    Problem: Infection - Risk of, Intrapartum Infection  Goal: Absence of infection signs and symptoms  Outcome: Progressing toward goal, anticipate improvement over: next 12-24 hours  VSS    Problem: Maternal-Fetal Status  Goal: Maternal - fetal well being  Outcome: Progressing toward goal, anticipate improvement over: next 12-24 hours  Cat 2 efm    Problem: Pain - Acute  Goal: Reduced pain sensation  Outcome: Progressing toward goal, anticipate improvement over: next 12-24 hours  Cont. epidural    Problem: Skin Integrity- Intact patient high risk for impaired skin integrity Braden scale less than or equal to 18  Goal: Skin remains intact  Outcome: Progressing toward goal, anticipate improvement over: next 12-24 hours  Linen changed freq to keep skin die

## 2016-08-21 NOTE — Brief Op Note (Signed)
BRIEF OPERATIVE NOTE    DATE: 08/21/2016  TIME: 2:38 PM    PREOPERATIVE DIAGNOSIS: 1) IUP 39 1/7 weeks 2) Arrest of Descent at 0 station, category 2 tracing 3) GDM, insulin 4) Desires permanent sterilization     POSTOPERATIVE DIAGNOSIS: same and atony, PPH, left firm hemorrhagic cyst suspected endometrioma - after cystectomy hemorrhagic CL    PROCEDURE INFORMATION:  Procedure(s):  DELIVERY, CESAREAN    ATTENDING SURGEON:   Surgeon(s) and Role:     * Lanai Conlee, Lynann Beaver, MD - Primary     * Wendee Beavers, MD - Resident - Assisting     * Margarita Sermons, MD - Resident - Assisting    ANESTHESIA: CLE converted to GETA    FINDINGS:  Vertex female, ROT, Apgars pending, Weight 7# 10oz    SPECIMENS: Placenta and tubal segments and left ovarian cyst    Fluids/Blood Products:      IV Fluids: LR    Blood Products: none    EBL: QBL 2 liters    Urine Output:    COMPLICATIONS: none    DISPOSITION: to PACU

## 2016-08-21 NOTE — Progress Notes (Signed)
Pt bedside to assess pushing    Pt starting to get frustrated with pushing, wants to make sure her baby is ok.    Fetus still 0 station with pushing, possible slight progress though difficult to assess secondary to increasing caput. No consistent descent into pelvis with pushing and concern for cephalopelvic disproportion. Dr. Horton Chin is going to bedside to further evaluate.    FHT Cat 2: overall reassuring with moderate variability however with tachycardia and recurrent variable decelerations with pushing.    Plan of care to be discussed, if pt without significant progress and persistent Cat 2 tracing will likely recommend primary c/s for suspected cephalopelvic disproportion and AOD.     Wendee Beavers[place] PID [id number]  Resident Physician PGY-1  Reproductive Medicine

## 2016-08-21 NOTE — Interdisciplinary (Signed)
Pt pushed back in bed to OR2 for primary c/s

## 2016-08-21 NOTE — Anesthesia Postprocedure Evaluation (Signed)
Anesthesia Transfer of Care Note    Patient: Theresa Pollard    Procedures performed: Procedure(s):  DELIVERY, CESAREAN    Vital signs: stable           Anesthesia Post Note    Patient: Theresa Pollard    Procedure(s) Performed: Procedure(s):  DELIVERY, CESAREAN      Final anesthesia type: Epidural    Patient location: PACU    Post anesthesia pain: adequate analgesia    Mental status: awake, alert  and oriented    Airway Patent: Yes    Last Vitals:   Vitals:    08/21/16 1600   BP: 118/78   Pulse: 118   Resp: 13   Temp:    SpO2: 96%       Post vital signs: stable    Hydration: adequate    N/V:no    Anesthetic complications: no    Plan of care per primary team.

## 2016-08-21 NOTE — Interdisciplinary (Signed)
Report given to Melissa, RN Postpartum and pt being transferred.

## 2016-08-21 NOTE — Progress Notes (Signed)
OB/GYN INTERVAL NOTE    Indication for Assessment: tachycardia    Subjective: Feeling fine sitting in bed. Endorses pain at the incision site. Denies nausea, vomiting, dizziness, lightheadedness, SOB, CP.     Objective:    08/21/16  1630 08/21/16  1655 08/21/16  1809 08/21/16  1852   BP:  120/60 116/68 106/60   Pulse: 119 134 129 118   Resp: Temp: 99 F (37.2 C) 100.2 F (37.9 C) 99.8 F (37.7 C) 99.7 F (37.6 C)   SpO2: 97% 92% 93% 92%       Gen: sitting up in bed, NAD  CV: regular tachycardic rhythm, nl s1/s2, no murmurs/rubs/gallops  Pulm: CTAB, good air movement bilaterally  Abd: obese, soft, moderate distended, fundus appreciated just above umbilicus which hangs low due to her habitus, appropriately tender near incision without rebound or guarding  Extr: 3+ pitting edema on b/l legs and up onto abdomen, nontender, no bruising or erythema, neg Homan's sign  Neuro: no clonus, 1+ reflexes    UOP: 50 cc/hr past 2 hours    Labs and Other Data:  pending    Assessment/Plan  Theresa Pollard is a 36 year old G66P5005 POD0 s/p pLTCS, BTL, left ovarian cystectomy at [redacted]w[redacted]d for arrest of descent with postpartum hemorrhage, endometritis diagnosed in PACU based on maternal tachycardia, leukocytosis of 23 and clinical concern for uterine infection.  - no evidence of ongoing blood loss on exam, specifically not suspicious for ongoing intraabdominal bleeding  - tachycardia may be multifactorial with suspected infection (chorio) and blood loss of 2L.   - borderline O2 saturation noted. Patient had general anesthesia and is asymptomatic without evidence on exam of VTE.  - EKG, labs, incentive [place]rom[place] [id number]  PGY4 OB/GYN

## 2016-08-21 NOTE — Progress Notes (Signed)
R4 Note    36 yo G5P4004 at [redacted]w[redacted]d admitted for IOL for GDMA2 vs T2DM on insulin. Patient has made progress and progressed to complete and -1. Leopolds 8-8.5# by my Leopolds, originally reported at 7#. She has has 4 prior SVDs, the largest of which was 7 lbs. Patient has pushed with excellent maternal effort for 1.5 hours without fetal descent past 0 station, with return to -1 in between each contraction. Now with significant molding. Palpated ROT and unable to rotate. During this time she has had a Category II tracing with recurrent deep variables and moderate variability, though now with more minimal variability. Discussed that I am concerned for CPD given her lack of descent and the size of the baby which she states feels much larger than any of her prior pregnancies. I am also concerned about fetal status with pushing. Given this lack of descent, recommendation for primary cesarean and BTL for arrest of descent. Patient in agreement. Consented for CS and BTL as below. Pitocin off with good return of variability and no further decels. OR and anesthesia notified.    Cesarean Section Consent    I have reviewed her interval History and Physical and have discussed the risks of cesarean section as described below.      The patient also desires permanent sterilization and has signed her consent form on 04/14/16. I have reviewed this, and the form is valid.    Risks of cesarean section:  -- Pain  -- Bleeding, with the possibility or requiring a transfusion. Risk of transfusion include allergic reaction (1/50,000), transmission of HIV (1/2 million), Hepatitis B or C 1/500,000 -750,000  -- Infection, requiring intravenous antibiotics and potentially prolonged hospital stay  -- Damage to surrounding organs, not limited to baby (<1%), bowel, bladder, nerves, intestines, vessels, ureters.  -- Possible need for hysterectomy in the event of irreversible catastrophic bleeding (<1%)  -- Wound complications not limited to  separation and/or infection  -- Medical complications not limited to deep venous thrombosis, pulmonary embolism, cardiovascular accident, myocardial infarction, death.    Risks of Bilateral tubal ligation   1. This is permanent sterilization  2. Risk of ectopic pregnancy 07/998 if pregnancy occurs    Time was allowed to answer all of the patients questions and concerns. The patient signed her consent in front of me and this was witnessed by her nurse[place] [id number]  Obstetrics and Gynecology  PGY-4  08/21/2016 11:36 AM

## 2016-08-21 NOTE — Progress Notes (Signed)
Labor Progress Note    S: Comfortable with CLE     O:    08/21/16  0300 08/21/16  0312 08/21/16  0342 08/21/16  0400   BP:  120/72 118/65    Pulse:  80 81    Resp: 18   18   Temp:       SpO2:         SVE: 4/50/-3, very anterior, head well engaged   FHT: 130's, mod variability, + accelerations, no decelerations  TOCO: q 2-4 minutes, intermittently not tracing  BOW: AROM 0505  Pit @ 16    A/P Theresa Pollard is a 36 year old at [id number] admitted for IOL GDMA2 vs T2DM  #. Labor- Minimal cervical change since last check. AROM performed with exam, scant blood-tinged fluid. Cont pit per protocol  #. FWB- Cat 1  #. Pain - CLE  #. GDM vs T2DM - last BG 90, wnl  #. Dispo - towards SVD    [name], MD  Signature Derived From Controlled Access Password, August 21, 2016, 5:08 AM

## 2016-08-21 NOTE — Interdisciplinary (Signed)
Report given to Theresa Abrahams RN. Care handed off and questions answered. EKG to be done at this time. Staff here to attempt getting stat labs. Unsuccessful x3 sticks, Theresa Abrahams RN NOC to attempt. Dr Willeen Cass here to assess pt.

## 2016-08-21 NOTE — Interdisciplinary (Signed)
Dr Horton Chin here at bs to discuss risk benefites about c/s and BTL. consent signed and witnessed. Pt agreed with poc

## 2016-08-21 NOTE — Progress Notes (Signed)
Labor Progress Note    S: Comfortable with CLE.     O:    08/21/16  0628 08/21/16  0630 08/21/16  0640 08/21/16  0642   BP:   112/64 107/61   Pulse: 80 80 89 86   Resp:       Temp:       SpO2: 96% 96% 96%      Recent Labs      08/20/16   1915  08/20/16   2301  08/21/16   0304  08/21/16   0640   GLUCPOCT  106*  96  90  93       SVE: 8/90/-2   FHT: 150's, mod variability, + accelerations, small variable decelerations  TOCO: q 2-4 minutes, intermittently not tracing  BOW: AROM 0505 am  Pit @ 14    IUD Consent:  I discussed the risks/benefits of postplacental IUD placement including risk of expulsion that is increased in postpartum period, risk of uterine perforation, bleeding with placement and irregular bleeding while in place, infection, ectopic pregnancy, and failure to place in correct position. I counseled her that we may not be able to place the IUD in case of chorioamnionitis/infection or hemorrhage. I counseled her of the importance of good postpartum follow up to ensure that the IUD is still in place and has not expelled. She expressed understanding and signed her consent form which was witnessed by her nurse.     A/P TERESSA MCGLOCKLIN is a 36 year old at [id number] admitted for IOL GDMA2 vs T2DM  #. Labor- Good cervical change since last check though baby is very high and exam is challenging. Will continue to follow labor curve closely and likely recheck in 1 hour. RN to place peanut ball now, continue maternal repositioning.   #. FWB- Cat 2, frequent small variable decels and early decels now improving with intrauterine resuscitative measures. Overall reassuring.   #. Contraception: Pt desires postpartum BTL however her BMI is 46 with central adiposity and her fundus is not likely to be easily palpable postpartum to allow for a safe BTL. I counseled her with regard to other options including nexplanon, depo as bridge to interval tubal, and post placental IUD. She feels strongly that she did not like nexplanon in  the past and depo made her gain weight. She is frustrated that she will likely not be able to have a tubal and would like a mirena IUD. See consent above. Order placed and pharmacy called, will send up device to the room soon.  #. Pain - CLE  #. GDM vs T2DM - last BG 93, wnl  #. Dispo - hopeful for SVD    [place]ee [place] P[place] [id number]  Resident Physician PGY-1  Reproductive Medicine

## 2016-08-22 DIAGNOSIS — Z09 Encounter for follow-up examination after completed treatment for conditions other than malignant neoplasm: Secondary | ICD-10-CM

## 2016-08-22 LAB — HEMOGRAM, BLOOD
Hct: 27.1 % — ABNORMAL LOW (ref 34.0–45.0)
Hgb: 9.4 gm/dL — ABNORMAL LOW (ref 11.2–15.7)
MCH: 31 pg (ref 26.0–32.0)
MCHC: 34.7 g/dL (ref 32.0–36.0)
MCV: 89.4 um3 (ref 79.0–95.0)
MPV: 11.8 fL (ref 9.4–12.4)
Plt Count: 146 10*3/uL (ref 140–370)
RBC: 3.03 10*6/uL — ABNORMAL LOW (ref 3.90–5.20)
RDW: 14.8 % — ABNORMAL HIGH (ref 12.0–14.0)
WBC: 13.4 10*3/uL — ABNORMAL HIGH (ref 4.0–10.0)

## 2016-08-22 LAB — PREPARE RH IMMUNE GLOBULIN

## 2016-08-22 LAB — GLUCOSE (POCT)
Glucose (POCT): 124 mg/dL — ABNORMAL HIGH (ref 70–99)
Glucose (POCT): 180 mg/dL — ABNORMAL HIGH (ref 70–99)
Glucose (POCT): 161 mg/dL — ABNORMAL HIGH (ref 70–99)
Glucose (POCT): 172 mg/dL — ABNORMAL HIGH (ref 70–99)
Glucose (POCT): 143 mg/dL — ABNORMAL HIGH (ref 70–99)

## 2016-08-22 MED ORDER — CLINDAMYCIN PHOSPHATE IN D5W 900 MG/50ML IV SOLN
900.0000 mg | Freq: Three times a day (TID) | INTRAVENOUS | Status: AC
Start: 2016-08-22 — End: 2016-08-22
  Administered 2016-08-22 (×2): 900 mg via INTRAVENOUS
  Filled 2016-08-22 (×2): qty 50

## 2016-08-22 NOTE — Lactation Note (Addendum)
This note was copied from a baby's chart.  Lactation Note    Reason for Consult: Baby transferred from NICU, Maternal support and education    Breastfeeding experience: Yes 1-2 year  Maternal Risk Factors: Gestational Diabetes, Mother/infant separation, Unplanned or emergency C-Section, Risk for delayed lactogenesis   Advanced Maternal Age, Significant maternal edema, Unplanned or emergency C-Section, Obesity (BMI#), EBL 2000  Infant Risk Factors: IDM (NICU after delivery, now in couplet care)    Preferred language: English     Mother's Name:  Theresa Pollard, Theresa Pollard  36 year old    Mother's MRN: [id number]   Obstetric History    G5   P5   T5   P0   A0   L5     SAB0   TAB0   Ectopic0   Multiple0   Live Births5    Obstetric Comments   Normal pap 2017.   No history of abnormal paps.    No GC/CT.    Irregular menses. Firm LMP.    GBS with prior pregnancies: no     The infant was born via C-Section, Low Transverse  Mom's problem list:   Patient Active Problem List   Diagnosis    Prediabetes    Rh negative state in antepartum period    Need for MMR vaccine    Zika virus exposure    AMA (advanced maternal age) multigravida 35+    Patient consented for MOMPOD Study (IRB [phone])    Amniotic fluid index borderline low    Obesity (BMI 40)    Request for sterilization    Irregular contractions    Intrauterine pregnancy    Follow up     Baby Information:  Hours of life: 2 days  GA: 39 1/7 weeks  Birth Weight: 122.05  Birth weight change: -2%   Baby's problem list   Patient Active Problem List   Diagnosis    Single liveborn, born in hospital, delivered by cesarean section; [redacted]w[redacted]d 3460g    Infant of diabetic mother    Other pneumothorax       Maternal Exam:  Breast Exam: Soft, Normal changes during pregnancy  Breast Shape: Small  Nipple Exam: Areola pliable, Slight tenderness  Milk Stage: Colostrum  Milk Stage Colostrum: Easily expressed  Medications of Concern: No    Infant Exam:  Infant State: Arouses with  stimulation  Tone: Appropriate for gestational age  Oral Anatomy: WNL/cupping   Jaw Movement: Gliding    Breast feeding observation  Session: Nutritive  Position: Football  Rooting: Opens with stimulation  Latch: Fair latch, needs significant assistance   Suck Assessment: Appropriate suck pattern  Shape of Nipple: Round  Milk Transfer: Minimal or none, Baby needs supplementation   Assessment: Beginning to breast feed, Progressing well, mother/baby needs some support   Quality: Fair    Pumping  Pump needed for discharge: Yes (mother does not have [place] will need hand pump/rental info)    Goal  Identifies nutritive suck/swallow assessment, Identifies signs of milk transfer, Infant increases swallows with breastfeeding, Increase milk volume, Improve maternal confidence with breastfeeding, Maintains pumping routine, Improve maternal confidence with pumping, Verbalizes feeling more comfortable, Verbalizes understanding of teaching plan    Teaching/Education  Teaching/Education: Normal feeding behavior (1st 24 hours), Cluster feeding (2nd 24 hours), Concept of "supply and demand", Physiology of milk production, Taught supplement technique, Taught positioning technique, Taught deeper latch technique, Taught breast massage and/or compression technique, Rationale for frequent pumping to support adequate milk production, Proper alignment to  facilitate good latch, Signs of Milk transfer, Skin to skin, Tactile stimulation before pumping, Pumping routine and frequency, Pumping techniques, Paced bottle feeding, Maternal nutrition/fluids, Milk collection and storage, Feeding cues, Formula, Instructed use of breast pump, Lactation supplies, Daily Feeding plan, Breastfeeding log, Breast/nipple care, Breast massage during pumping, Cleaning pumping equipment  Taught Supplement Technique: SNS, Bottle  Pumping Techniques: Breast compressions  Milk Collection and Storage: Appropriate storage containers for milk volume  Cleaning Pumping  Technique: Soap, Steam bag    Handouts  Handouts reviewed/given: Breastfeeding Log, NBN Channel guide, Support group information    Plan  Breastfeed at least 8 or more times in 24 hours (on demand), Skin to skin, Start/continue supplementation (SNS or paced bottle if no support person availbale)  Lactation Supplies Provided: Bottle, Lanolin, Pump, SNS setup, Steam bag    Follow-up  Next day    Additional note: Infant transferred from NICU to couplet care this afternoon. Mother reports she successfully breastfed her other children, youngest not 8. Infant has been given the bottle while in NICU for hypoglycemia and mother has not been pumping. Mother started pumping with 1 mL achieved. It is reccommended that mother continues to supplement until her milk comes in .Daily feeding plan provided and reviewed, mother states understanding. Assisted mother with SNS and infant did well at the breast, if she doesn't have a support person at the bedside instructed her to breastfeed first then she may offer paced bottle feeding for supplementation after. Lactation to follow up tomorrow.

## 2016-08-22 NOTE — Progress Notes (Signed)
OB/GYN INTERVAL NOTE    Objective:    08/21/16  1809 08/21/16  1852 08/21/16  2048 08/22/16  0212   BP: 116/68 106/60 125/71 117/73   Pulse: 129 118 112 101   Resp: Temp: 99.8 F (37.7 C) 99.7 F (37.6 C) 99.1 F (37.3 C) 98.5 F (36.9 C)   SpO2: 93% 92%  99%     UOP: 500 mL/9 hr > 50 mL/hr    EKG: sinus tachycardia    Labs and Other Data:  Lab Results   Component Value Date    K 4.4 08/21/2016    CL 103 08/21/2016    BICARB 20 (L) 08/21/2016    BUN 10 08/21/2016    CREAT 0.60 08/21/2016    GLU 144 (H) 08/21/2016    [place] 8.1 (L) 08/21/2016     Lab Results   Component Value Date    WBC 21.9 (H) 08/21/2016    HGB 10.8 (L) 08/21/2016    HCT 32.9 (L) 08/21/2016    PLT 187 08/21/2016    SEG 86 08/21/2016    LYMPHS 8 08/21/2016    MONOS 6 08/21/2016     Lab Results   Component Value Date    INR 1.0 08/21/2016    PTT 32 08/21/2016       Assessment/Plan  Tachycardia improving. Labs reasonable. Will continue clindamycin to complete 24 hours of treatment for suspected endometritis[place] [id number]  PGY4 OB/GYN

## 2016-08-22 NOTE — Lactation Note (Signed)
This note was copied from a baby's chart.  My Term Infant Feeding Plan    My name is: Theresa Pollard      Watch for my hunger cues, don't wait for me to cry.   Please keep me skin to skin when you are awake and feeling well.  This helps your milk to come in faster.   Breastfeed me at least 8-12 times in 24 hours using feeding cues.   No pacifiers please!    Supplement feeding: at the breast    Supplement using: expressed breastmilk and term formula    For the next 24 hours please give me 10-20 mL every 3 hours or sooner    After feeding, mom can pump for 15 minutes using the initiation mode. Any expressed milk can be used for supplementing my next feeding.     In addition to pumping, hand expression before, after or between feedings is very helpful in increasing mom's milk supply. For videos on hand expression please refer to:   [email or web address]   [email or web address]    Completed by:  Stana Bunting, RN  Date: 08/22/2016

## 2016-08-22 NOTE — Progress Notes (Addendum)
POSTPARTUM NOTE  Theresa Pollard is a 36 year old G5P5005 currently POD1 s/p pLTCS, BTL, left ovarian cystectomy  for AOD and Cat 2 FHT remote from delivery at [redacted]w[redacted]d Postoperative course complicated by endometritis diagnosed by tachycardia in PACU and leukocytosis of 23 with concern for uterine infection during labor.     Pregnancy Significant For:   #. Obesity - BMI 46.   #. MOMPOD trial - Contacted research coordinator KAgustina Carolivia epic message and paged Dr. RNelva Bushon admission.  #. 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 Taking Metformin 1000 mg BID, NPH 16 + Humalog 16 w/ breakfast, Humalog 14 with lungh, Humalog 62 and NPH 66 at bedtime.   #. Rubella non-immune   #. Rh negative - s/p RhoGam 06/09/16    Method of Delivery: pLTCS  Estimated Blood Loss: 20010m   Date of birth: 08/21/2016 Time of birth: 12:37 PM Gestational Age:22 1/7     SUBJECTIVE  Patient reports feeling very well. Denies having significant pain.     Ambulation: not yet  Void Spontaneous: foley in place  Pain Score: 8  Lochia: minimal, less than menses  Breastfeeding: not yet, plans to. Would like to see lactation  Diet: Diet Clear Liquid On day of procedure.  Diet Regular Beginning on POD#1. , without nausea or vomiting  Flatus: not yet  Other: Denies shortness of breath, dizziness, headache, vision changes, right upper quadrant pain.    OBJECTIVE    Temperature:  [98 F (36.7 C)-100.2 F (37.9 C)] 98.5 F (36.9 C) (04/20 024034 Blood pressure (BP): (105-149)/(54-99) 117/73 (04/20 0212)  Heart Rate:  [81-134] 101 (04/20 0212)  Respirations:  [8-28] 20 (04/20 0212)  Pain Score: 8 (04/20 0401)  O2 Device: None (Room air) (04/20 0212)  O2 Flow Rate (L/min):  [2 l/min-10 l/min] 2 l/min (04/19 1630)  SpO2:  [92 %-100 %] 99 % (04/20 0212)  Mild range pressures in few hours postop, subsequently normotensive. HR in 90s this AM during exam. Tachycardia persistently in 100s-134 maximum.     General: NAD  Heart: normal rate, regular rhythm; no  murmurs, rubs, or gallops  Lungs: clear to ascultation; no increased work of breathing  Abdomen: Positive bowel sounds, minimally tender without rebound or guarding  Fundus: firm fundus 1cm below umbilicus. No fundal tenderness.   Incision: clean/dry/intact; staples/steri strips in place  Perineum: moderate swelling, non tender  Extremities: 2+ edema in feet, symmetric  Neurology: 1+ reflexes    Medications:  PRNs in past 12hrs  Oxycodone '10mg'$  x 2  Oxycodone '5mg'$  x 1    Urine output: 60018m hrs = 200 mL/hr 0100-0359    LABS  Lab Results   Component Value Date    WBC 21.9 08/21/2016    RBC 3.68 08/21/2016    HGB 10.8 08/21/2016    HCT 32.9 08/21/2016    MCV 89.4 08/21/2016    MCHC 32.8 08/21/2016    RDW 14.5 08/21/2016    PLT 187 08/21/2016    MPV 12.1 08/21/2016     Recent Labs      08/21/16   1429  08/21/16   1626  08/21/16   2025  08/21/16   2202   GLUCPOCT  132*  129*  147*  148*       Antepartum EDS: 1  Postpartum EDS: not indicated    ASSESSMENT/PLAN  Theresa Pollard a 35 58ar old G5P5005 currently POD1 s/p pLTCS, BTL, left ovarian  cystectomy  for AOD and Cat 2 FHT remote from delivery at [redacted]w[redacted]d Postoperative course complicated by endometritis diagnosed by tachycardia in PACU and leukocytosis of 23 with concern for uterine infection during labor.     #. Post-partum/operative day 1:   -- remove foley and f/u void  -- f/u AM CBC, Hgb 12.1->11.6->10.8-->pending  -- encourage safe ambulation as tolerated  -- support lactation    #. Endometritis: Tachycardia postop with suspected intrapartum chorio (fetal tachycardia). Maternal tmax to 100.2 at 1655 on 4/19. Pt tachycardic, improved from PACU but persistent. EKG sinus tachycardia.   - gent/clinda x 24hr postpartum (never febrile)    #. Obesity - BMI 46.   - lovenox held in context of QBL 2L, will consider pending AM CBC  - receiving gent/clinda x 24hr for endometritis, will not get keflex/flagyl    #. MOMPOD trial - Contacted research coordinator KAgustina Caroli via epic message and paged Dr. RNelva Bushon admission.  - Dr. RNelva Bushmessaged this AM, pt NOT to be discharged without clearance by Dr. RNelva Bush(needs to take baby measurements)    #. 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 Taking Metformin 1000 mg BID, NPH 16/66, Humalog 16/14/62 antepartum.   - FSG within goal (129-148) PP though pt not eating, continue to follow  - Postpartum plan per CDE: Metformin 500 BID if necessary    #. Zika virus exposure - neg testing qtrimester  - placenta to path    #. Rh negative - s/p RhoGam 06/09/16  - KB neg, baby Rh POS- s/p rhogam 0545 4/20    #. Breast Feeding: plans to, baby in NICU. Lactation consult ordered.   #. Birth Control Method: s/p BTL, added to triage f/u list ; counseled with regard to pelvic rest and interpregnancy spacing  #. Prenatal labs: O NEG, Rubella non-immune, quant neg  - to be given MMR  #. Health Care Maintenance: Influenza up to date, Tdap up to date  #. Disposition: Anticipate discharge home day 2-4  #. Follow up: 6 week    KClent Demark MD PID [id number] Resident Physician PGY-1  Reproductive Medicine   Pager: [phone]   5:42 AM 08/22/16    Addendum:  Note and plan of care reviewed and edited as appropriate.     CAntony Haste MD PY-4  Obstetrics and Gynecology  PID [id number] Pager [phone]   08/22/2016 6:54 AM    OB Attending:    Pt seen by me. POD # 1 s/p primary LTC/S,BTL, DM c/b endometritis.  Pt hemodynamically stable, VSS, afebrile. Good UOP.   Exam as per intern note. Incision C/D and I.  Post-op Hgb 9.4  Plan: continue routine post-op care.  I agree with assessment and plan as per resident note.  See resident note for further details.      KAxel FillerMD [id number]

## 2016-08-22 NOTE — Interdisciplinary (Signed)
Pt c/o sore throat and pain with swallowing. States voice has been hoarse all day. Pt given hot peppermint tea and instructed to inform RN if she needs medication for sore throat.

## 2016-08-22 NOTE — Plan of Care (Signed)
Problem: Fluid Volume Imbalance, Risk of  Goal: Absence of postpartum hemorrhage  Medication management may include oxytocin (for prevention, also), Methergine (avoid if hypertensive), prostaglandin.  Outcome: Goal Met      Problem: Infection Risk  Goal: Absence of infection signs and symptoms  Outcome: Progressing toward goal, anticipate improvement over: next 12-24 hours  Pt temp WNL, pt with mild tachycardia but no other s/s of infection. S/P Clindamycin.     Problem: Pain - Acute  Goal: Reduced pain sensation  Outcome: Progressing toward goal, anticipate improvement over: next 12-24 hours  Pt requiring Motrin and Oxycodone for pain relief. Pt also with c/o gas pains, medicated with Simethicone prn.     Problem: Urinary Retention  Goal: Able to void spontaneously  Outcome: Goal Met

## 2016-08-22 NOTE — Progress Notes (Signed)
OB ANESTHESIA POST EPIDURAL FOLLOWUP NOTE    Referring MD: OB Service    Reason for Encounter: Labor Epidural    Pain is under control on current regimen.    Pt denies N/V, itching, HA, motor or sensory deficit. Pt currently comfortable. No signs of anesthetic complications at this time.    Plan of care per primary team.

## 2016-08-22 NOTE — Interdisciplinary (Signed)
This LVN assisted pt up to void, ambulated well. Ice pack to perineum was given, placed call light within reach.
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
## 2016-08-23 LAB — HEMOGRAM, BLOOD
Hct: 24.1 % — ABNORMAL LOW (ref 34.0–45.0)
Hgb: 8.3 gm/dL — ABNORMAL LOW (ref 11.2–15.7)
MCH: 30.4 pg (ref 26.0–32.0)
MCHC: 34.4 g/dL (ref 32.0–36.0)
MCV: 88.3 um3 (ref 79.0–95.0)
MPV: 11.3 fL (ref 9.4–12.4)
Plt Count: 169 10*3/uL (ref 140–370)
RBC: 2.73 10*6/uL — ABNORMAL LOW (ref 3.90–5.20)
RDW: 14.4 % — ABNORMAL HIGH (ref 12.0–14.0)
WBC: 9.9 10*3/uL (ref 4.0–10.0)

## 2016-08-23 LAB — GLUCOSE (POCT)
Glucose (POCT): 115 mg/dL — ABNORMAL HIGH (ref 70–99)
Glucose (POCT): 171 mg/dL — ABNORMAL HIGH (ref 70–99)
Glucose (POCT): 183 mg/dL — ABNORMAL HIGH (ref 70–99)
Glucose (POCT): 176 mg/dL — ABNORMAL HIGH (ref 70–99)
Glucose (POCT): 166 mg/dL — ABNORMAL HIGH (ref 70–99)

## 2016-08-23 NOTE — Progress Notes (Signed)
POSTPARTUM NOTE  Theresa Pollard is a 36 year old G5P5005 currently POD2 s/p pLTCS, BTL, left ovarian cystectomy  for AOD and Cat 2 FHT remote from delivery at [redacted]w[redacted]d Postoperative course complicated by endometritis diagnosed by tachycardia in PACU and leukocytosis of 23 with concern for uterine infection during labor.     Pregnancy Significant For:   #. Obesity - BMI 46.   #. MOMPOD trial - Contacted research coordinator KAgustina Carolivia epic message and paged Dr. RNelva Bushon admission.  #. 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 Taking Metformin 1000 mg BID, NPH 16 + Humalog 16 w/ breakfast, Humalog 14 with lunch, Humalog 62 and NPH 66 at bedtime.   #. Rubella non-immune   #. Rh negative - s/p RhoGam 06/09/16    Method of Delivery: pLTCS  Estimated Blood Loss: 20034m   Date of birth: 08/21/2016 Time of birth: 12:37 PM Gestational Age:69 1/7     SUBJECTIVE  Patient reports feeling very well. Denies having significant pain. Would very much like to go home today.      Ambulation: yes  Void Spontaneous: yes  Pain Score: 7  Lochia: minimal, less than menses  Breastfeeding: yes, no issues  Diet: Diet Regular Beginning on POD#1. , without nausea or vomiting  Flatus: yes  Other: Denies shortness of breath, dizziness, headache, vision changes, right upper quadrant pain.    OBJECTIVE    Temperature:  [98.9 F (37.2 C)-99.5 F (37.5 C)] 98.9 F (37.2 C) (04/21 0030)  Blood pressure (BP): (102-127)/(51-84) 117/72 (04/21 0030)  Heart Rate:  [103-114] 111 (04/21 0030)  Respirations:  [18-20] 18 (04/21 0030)  Pain Score: 2 (04/21 0543)  O2 Device: None (Room air) (04/21 0030)  Mild range pressures in few hours postop, subsequently normotensive. HR in 90s this AM during exam. Tachycardia persistently in 100s-110s.     General: NAD  Heart: normal rate, regular rhythm; no murmurs, rubs, or gallops  Lungs: clear to ascultation; no increased work of breathing  Abdomen: Positive bowel sounds, minimally tender without rebound or  guarding  Fundus: firm fundus 1cm below umbilicus. No fundal tenderness.   Incision: clean/dry/intact; steri strips in place  Perineum: moderate swelling, non tender  Extremities: 2+ edema in feet, symmetric  Neurology: 1+ reflexes    Medications:  PRNs in past 12hrs  Acetaminophen 65082m 1  Oxycodone 82m51m3    Urine output: Voiding spontaneously, no longer strictly recorded but previously adequate. Pt reports voiding regularly.       LABS  Lab Results   Component Value Date    WBC 13.4 08/22/2016    RBC 3.03 08/22/2016    HGB 9.4 08/22/2016    HCT 27.1 08/22/2016    MCV 89.4 08/22/2016    MCHC 34.7 08/22/2016    RDW 14.8 08/22/2016    PLT 146 08/22/2016    MPV 11.8 08/22/2016     Recent Labs      08/22/16   1247  08/22/16   2020  08/22/16   2214  08/23/16   0551   GLUCPOCT  161*  172*  143*  115*       Antepartum EDS: 1  Postpartum EDS: not indicated    ASSESSMENT/PLAN  Theresa Pollard 35 y37r old G5P5005 currently POD2 s/p pLTCS, BTL, left ovarian cystectomy  for AOD and Cat 2 FHT remote from delivery at [id number] course complicated by endometritis diagnosed by tachycardia in PACU  and leukocytosis of 23 with concern for uterine infection during labor.     #. Post-partum/operative day 2: meeting milestones   -- f/u AM CBC, Hgb 12.1->11.6->10.8-->9.4, overall expected drop in hemoglobin. Will d/c with iron and colace.   -- encourage safe ambulation as tolerated  -- support lactation    #. Endometritis: Tachycardia postop with suspected intrapartum chorio (fetal tachycardia). Maternal tmax to 100.2 at 1655 on 4/19. Pt tachycardic, improved from PACU but persistent. EKG sinus tachycardia.   - s/p gent/clinda x 24hr postpartum (never febrile)    #. Obesity - BMI 46.   - lovenox held in context of QBL 2L  - receiving gent/clinda x 24hr for endometritis, will not get keflex/flagyl    #. MOMPOD trial - Contacted research coordinator Agustina Caroli via epic message and paged Dr. Nelva Bush on admission.  - Dr.  Nelva Bush messaged this AM, pt NOT to be discharged without clearance by Dr. Nelva Bush (needs to take baby measurements)    #. 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 Taking Metformin 1000 mg BID, NPH 16/66, Humalog 16/14/62 antepartum.   - FSG 124-180 PP on regular diet  - Postpartum plan per CDE: Metformin 500 BID, to be restarted on discharge    #. Zika virus exposure - neg testing qtrimester  - placenta to path    #. Rh negative - s/p RhoGam 06/09/16  - KB neg, baby Rh POS- s/p rhogam 0545 4/20    #. Breast Feeding: plans to, baby in NICU. Lactation consult ordered.   #. Birth Control Method: s/p BTL and ovarian cystectomy, added to triage f/u list ; counseled with regard to pelvic rest and interpregnancy spacing  #. Prenatal labs: O NEG, Rubella non-immune, quant neg  - to be given MMR  #. Health Care Maintenance: Influenza up to date, Tdap up to date  #. Disposition: Anticipate discharge home today  #. Follow up: 6 week    Clent Demark, MD PID [id number]  Resident Physician PGY-1  Reproductive Medicine   Pager: [phone]        OB/MFM Attending  Patient seen, discussed, examined and agree with Dr. Lajean Saver assessment  and plan.  Pt is a 36 year old G5P5005 POD  2 s/p pLTCS with BTL and LO cystectomy for arrest of labor/category II.    Pregnancy complicated UQ:[id number], GDM-A2  BP 121/79 (BP Location: Right arm, BP Patient Position: Standing)   Pulse 102   Temp 98.7 F (37.1 C)   Resp 16   Ht 4' 7"  (1.397 m)   Wt 89.8 kg (198 lb)   LMP 11/21/2015 (Exact Date)   SpO2 99%   Breastfeeding? Unknown   BMI 46.02 kg/m2  Lab Results   Component Value Date    WBC 13.4 08/22/2016    RBC 3.03 08/22/2016    HGB 9.4 08/22/2016    HCT 27.1 08/22/2016    MCV 89.4 08/22/2016    MCHC 34.7 08/22/2016    RDW 14.8 08/22/2016    PLT 146 08/22/2016    MPV 11.8 08/22/2016     Lab Results   Component Value Date    BUN 10 08/21/2016    CREAT 0.60 08/21/2016    CL 103 08/21/2016    NA 137 08/21/2016    K 4.4 08/21/2016    [place] 8.1 08/21/2016    AST  17 03/24/2016    BICARB 20 08/21/2016    ALT 12 03/24/2016    GLU 144 08/21/2016  Recent Labs      08/22/16   1247  08/22/16   2020  08/22/16   2214  08/23/16   0551   GLUCPOCT  161*  172*  143*  115*     FF ~U, Abd appropp tender  Bandage/Incision C/D/I  Ext: trace edema  A /P  -meeting milestones  -RH s/p rhogam  -delivery c/b possible chorio and PPH, s/p antibiotics  -GDM-d/c on metformin  -PPH, hgb stable    Patient desires discharge, PM check, follow HR (mild tachycardia) Please see above resident note for full details.      Eilleen Kempf, MD  Maternal Fetal Medicine  08/23/2016  8:08 AM

## 2016-08-23 NOTE — Plan of Care (Signed)
Problem: Falls, Risk of  Goal: Keep patient free from falls utilizing universal fall precautions  Outcome: Progressing toward goal, anticipate improvement over: next 12-24 hours      Problem: Pain - Acute  Goal: Reduced pain sensation  Outcome: Progressing toward goal, anticipate improvement over: next 12-24 hours

## 2016-08-23 NOTE — Lactation Note (Signed)
This note was copied from a baby's chart.  Lactation follow up: Mother requesting to be discharged today. She states she has been pumping after feeds with little output. Risk factors for delayed lactogenesis include unplanned cesarean section, AMA, GDM, obesity, peripheral edema, and EBL of 2000. Discharge feeding plan provided and reviewed with mother. Instructed her to put infant to breast for 15-20 minutes on each side and follow up with supplementation via paced bottle feeding per maternal preference as she had trouble with SNS. Provided her with a hand pump and she states she plans to go to the [facility] office on Monday to sign up and get a breast pump. Mother encouraged to attend a breastfeeding support group when milk comes in to have a pre and post weight completed so feeding plan can be adjusted. Discussed with mother signs to monitor infant is getting enough at the breast including monitoring voids and stools, listening for audible swallowing, ensuring infant actively feeds for 15-20 minutes then offer other breast, infant satisfied after feeds, breasts softer after feeds, and monitoring weight with outpatient pediatrics. Reviewed the physiology of milk production, positioning to ensure a deep latch, nipple care, paced bottle feeding, hand pump use, milk storage /collection, the breastfeeding log book and to monitor for voids and stools. Encouraged skin to skin and feeding at least 8-12 times per 24 hours and on demand. Mother provided information on breastfeeding support groups and [place]'s warm line. Plan is to discharge today and follow up with outpatient pediatrics.

## 2016-08-23 NOTE — Lactation Note (Signed)
This note was copied from a baby's chart.  TERM INFANT FEEDING INSTRUCTIONS FOR DISCHARGE    1.  Breastfeed every 3 hours or sooner using feeding cues for 15-20 minutes on each side, then supplement as outlined below. Mother encouraged to attend a breastfeeding support group when milk comes in to have a pre and post weight completed so feeding plan can be adjusted.     2.   Supplement feeding           With Bottle    3.  Supplement using    Expressed Breast Milk and Formula  Safe Formula Preparation  Clean up before preparation:   Wash your hands with soap and water   Clean work surfaces, such as countertops and sinks    Liquid Formula: We recommend use of liquid formula for the first 2 months because it is sterile. It is rare, but there can be bacteria that grows in powdered formula, that make babies very sick.  There are 2 kinds of liquid formula: liquid concentrate and ready-to-feed formula:   Liquid concentrate needs water added. Mix equal parts water with formula and shake to mix.   Ready-to-feed formula is ready for use and should never me mixed with water.   Opened liquid formula can be stored covered, in the refrigerator for up to 48 hours.   Warming is not necessary but if you want to warm liquid formula, run warm tap water over the bottle or place it in a pan of hot (not boiling) water. Shake the bottle occasionally while warming. Test the formula temperature before feeding. It should not feel hot or cold when dropped on your wrist. Do not use the microwave to warm formula, serious burns may result.     Powder Formula: If your baby gets powdered formula, follow these steps:    Prepare safely:  o Keep powdered formula lids and scoops clean (be careful about what they touch).  o Close containers of infant formula as soon as possible.  o Use hot water (158 degrees F/70 degrees C and above) to make formula.  o Carefully shake, rather than stir, formula in the bottle.  o Cool formula to ensure it is not  too hot before feeding your baby by running the prepared, capped bottle under cool water or placing it into an ice bath, taking care to keep the cooling water from getting into the bottle or on the nipple  o Before feeding the baby, test the temperature by shaking a few drops on your wrist, it should not feel hot or cold when dropped on your wrist.   Use up quickly or store safely:  o Use formula within 2 hours of preparation. If the baby does not finish the entire bottle of formula, throw away the unused formula.  o If you do not plan to use the prepared formula right away, refrigerate it immediately and use it within 24 hours. Refrigeration slows the growth of germs and increases safety.  When in doubt, throw it out. If you can't remember how long you have kept formula in the refrigerator, it is safer to throw it out than to feed it to your baby.      4.  Give 10-20 ml with each feeding on day of discharge for day of life 2, 08/23/2016    5.  Give supplement for each feeding as shown:  Sunday 20-30 mL  Monday 30-40 mL  Tuesday 40-50 mL  Wednesday 50-60 mL  Thursday and  beyond for the first month give 60-90 mL       After five days of life change to breast followed by bottle or commercial supplemental nursing system.     6.  Continue supplementing until mom's milk supply increases so that:   1.  You hear gulping when feeding   2.  Mom's breasts are softer after feedings   3.  Baby acting satisfied    4.  Baby gaining weight    5.  Baby's medical care provider agrees supplement no longer needed    7.  Continue to pump to help maintain milk supply. Use for supplement until baby is able to empty breast well.         8. Support Group References:   [facility]   [address], 3rd Floor  [place] [address]  Thursdays 2:00 - 3:00 pm    [facility]  [address], Nursery Room  [place], [place] [address]  Wednesdays 10:00- 11:30 a.m.    [facility]  [facility], Conference Rooms A & B  [address]  [place] [address]  Mondays 3:30 - 5:00 pm  Wednesday/Fridays 2 - 3:30 pm    [facility]  1st Floor Conference Room (Room [phone], off the hallway near the employee elevators)  [address]  [place] [address]  Tuesdays 1 - 3:00 pm    Questions? Call [place] [facility]  [phone]

## 2016-08-24 ENCOUNTER — Other Ambulatory Visit: Payer: Self-pay | Admitting: Obstetrics & Gynecology

## 2016-08-24 LAB — CBC WITH DIFF, BLOOD
ANC-Automated: 5.9 10*3/uL (ref 1.6–7.0)
Abs Eosinophils: 0.1 10*3/uL (ref 0.1–0.5)
Abs Lymphs: 1.7 10*3/uL (ref 0.8–3.1)
Abs Monos: 0.4 10*3/uL (ref 0.2–0.8)
Basophils: 0 %
Eosinophils: 2 %
Hct: 26.3 % — ABNORMAL LOW (ref 34.0–45.0)
Hgb: 8.8 gm/dL — ABNORMAL LOW (ref 11.2–15.7)
Imm Gran Abs: 0.1 10*3/uL (ref <0.1)
Lymphocytes: 21 %
MCH: 29.7 pg (ref 26.0–32.0)
MCHC: 33.5 g/dL (ref 32.0–36.0)
MCV: 88.9 um3 (ref 79.0–95.0)
MPV: 11.7 fL (ref 9.4–12.4)
Monocytes: 5 %
Plt Count: 198 10*3/uL (ref 140–370)
RBC: 2.96 10*6/uL — ABNORMAL LOW (ref 3.90–5.20)
RDW: 14.3 % — ABNORMAL HIGH (ref 12.0–14.0)
Segs: 72 %
WBC: 8.3 10*3/uL (ref 4.0–10.0)

## 2016-08-24 LAB — GLUCOSE (POCT)
Glucose (POCT): 125 mg/dL — ABNORMAL HIGH (ref 70–99)
Glucose (POCT): 213 mg/dL — ABNORMAL HIGH (ref 70–99)
Glucose (POCT): 211 mg/dL — ABNORMAL HIGH (ref 70–99)
Glucose (POCT): 161 mg/dL — ABNORMAL HIGH (ref 70–99)
Glucose (POCT): 127 mg/dL — ABNORMAL HIGH (ref 70–99)

## 2016-08-24 MED ORDER — FERROUS SULFATE 325 (65 FE) MG OR TABS
325.00 mg | ORAL_TABLET | ORAL | 0 refills | Status: DC
Start: 2016-08-24 — End: 2017-06-29

## 2016-08-24 MED ORDER — DOCUSATE SODIUM 250 MG OR CAPS
250.00 mg | ORAL_CAPSULE | ORAL | 0 refills | Status: DC
Start: 2016-08-24 — End: 2017-06-29

## 2016-08-24 MED ORDER — METFORMIN HCL 500 MG OR TB24
500.0000 mg | ORAL_TABLET | Freq: Two times a day (BID) | ORAL | 0 refills | Status: DC
Start: 2016-08-24 — End: 2016-10-01

## 2016-08-24 MED ORDER — IBUPROFEN 600 MG OR TABS
600.00 mg | ORAL_TABLET | ORAL | 0 refills | Status: DC
Start: 2016-08-24 — End: 2017-06-29

## 2016-08-24 MED ORDER — IBUPROFEN 600 MG OR TABS
600.0000 mg | ORAL_TABLET | Freq: Four times a day (QID) | ORAL | 0 refills | Status: DC | PRN
Start: 2016-08-24 — End: 2017-06-27

## 2016-08-24 MED ORDER — DOCUSATE SODIUM 250 MG OR CAPS
250.0000 mg | ORAL_CAPSULE | Freq: Two times a day (BID) | ORAL | 0 refills | Status: DC
Start: 2016-08-24 — End: 2017-06-27

## 2016-08-24 MED ORDER — HYDROCODONE-ACETAMINOPHEN 5-325 MG OR TABS
1.0000 | ORAL_TABLET | Freq: Four times a day (QID) | ORAL | 0 refills | Status: AC | PRN
Start: 2016-08-24 — End: ?

## 2016-08-24 MED ORDER — METFORMIN HCL 500 MG OR TB24
500.00 mg | ORAL_TABLET | ORAL | 0 refills | Status: DC
Start: 2016-08-24 — End: 2017-06-29

## 2016-08-24 MED ORDER — FERROUS SULFATE 324 (65 FE) MG PO TBEC
324.0000 mg | DELAYED_RELEASE_TABLET | Freq: Two times a day (BID) | ORAL | 0 refills | Status: AC
Start: 2016-08-24 — End: 2016-10-08

## 2016-08-24 NOTE — Plan of Care (Signed)
Problem: Fluid Volume Imbalance, Risk of  Goal: Absence of postpartum hemorrhage  Medication management may include oxytocin (for prevention, also), Methergine (avoid if hypertensive), prostaglandin.   Outcome: Unable to meet goal at this time.  EBL of 2000 ml. H&H stable. Pt asymptomatic.    Problem: Infection Risk  Goal: Absence of infection signs and symptoms  Outcome: Goal Met      Problem: Pain - Acute  Goal: Reduced pain sensation  Outcome: Goal Met  Pain managed with PRN tylenol and roxicodone. Pt also using abdominal binder for non-pharmacologic pain relief.    Problem: Discharge Planning  Goal: Able to care for self, ambulate independently and adequate nutritional/ fluid intake  Outcome: Goal Met    Goal: Demonstrate knowledge of infant care and infant safety  Outcome: Goal Met    Goal: Demonstrate infant feeding and parent-infant attachment behavior  Outcome: Goal Met    Goal: Demonstrate knowledge of discharge instructions  Outcome: Progressing toward goal, anticipate improvement over: next 12-24 hours  Discharge teaching ongoing.

## 2016-08-24 NOTE — Plan of Care (Signed)
Problem: Fluid Volume Imbalance, Risk of  Goal: Absence of postpartum hemorrhage  Medication management may include oxytocin (for prevention, also), Methergine (avoid if hypertensive), prostaglandin.   Outcome: Progressing toward goal, anticipate improvement over: next 12-24 hours      Problem: Infection Risk  Goal: Absence of infection signs and symptoms  Outcome: Progressing toward goal, anticipate improvement over: next 12-24 hours      Problem: Pain - Acute  Goal: Reduced pain sensation  Outcome: Progressing toward goal, anticipate improvement over: next 12-24 hours      Problem: Urinary Retention  Goal: Able to void spontaneously  Outcome: Resolved Date Met: 08/24/16  Pt able to void independently with adequate amounts.

## 2016-08-24 NOTE — Lactation Note (Signed)
This note was copied from a baby's chart.  Mom reports BF is going well. Newborn (+30gr)  -1.2 %,adequate voids/stools. Feeding plan is BF and supplementation with Formula 30-40 ml/feed for today.. Encouraged Mom to start pumping to increase her milk supply.As per Mom she keeps baby for 10 min on each breast. Instructed Mom to put   Infant to breast  for 15-20 min then offer other breast. Mom's nipples non tender and intact. Reviewed normal newborn feeding patterns,signs of milk transfer and Breastfeeding Log Book. Discussed options for outpatient breastfeeding support and provided phone number for [place] warm line.

## 2016-08-24 NOTE — Discharge Instructions (Signed)
Future Appointments  Date Time Provider Department Center   10/01/2016 9:40 AM Ngan, Rolm Gala, MD [facility]       Discharge Instructions     Diet at home / Dieta en casa: Regular    Activity instructions (instrucciones de actividades):  -- DO NOT engage in sexual activity for 4 - 6 weeks.      NO tenga relaciones sexuales de 4 - 6 semanas.  -- DO NOT place anything in the vagina for 4 - 6 weeks.      NO ponga ninguna cosa en la vagina de 4 - 6 semanas.  -- DO NOT lift more than the baby (or 10lbs).      NO levante nada mas pesado que el bebe (o 10 libras).  -- DO NOT take tub baths.  It is OK to shower.      No tome un bano.  Es aceptable tomar [employer].    Your medication list is located on this After Visit Summary in the Medication section.  Una lista de sus medicamentos esta incuido en estas instrucciones.    Signs and Symptoms to Report (Sintomas y senales que debe reportar)    Call your hospital physician if you have / Llame a su medico si tiene:  -- Increased pain / aumenta el dolor  -- Heavy vaginal bleeding or clots / sandrago vaginal severo o coagulos  -- Temperature above 100 F / temperatura mas que 100 F  -- Foul smelling discharge / desecho con Doctor, hospital  -- Observe for signs of mastitis (breast infection), including redness, warmth, or severe tenderness in one or both breasts associated with fever / observe por indicaciones de mastitis (infeccion de seno), incluyendo rojo, calor, o dolor intenso en uno o los dos senos asociado con Emigrant.    Phone number to call / Numero de telephono para llamar:  [phone]    If You Had a C-Section  (Si tuvo operacion cesaria)    -- When taking a shower, let warm water flow freely on the incision.      Cuando tome una ducha, deje que el agua fluir libremente sobre la incision.  -- Do not use harsh or perfumed soaps.      No use jabon duro o perfumado.  -- Monitor for signs of infection around the wound, including redness, warmth, or      increasing tenderness  around the wound.      Observe por indicaciones de infeccion sobre la herida, incluyendo rojo, Airline pilot,      o mas dolor sobre la herida.  -- Do not drive while taking narcotic pain medications.      No maneje cuando tomando medicamentos fuertes para Chief Technology Officer.  -- If your staples were removed in the hospital, the Steri-Strips need to stay on for 7 to 10 days.  After 10 days, please remove them.  If staples were left in place, please follow-up as instructed in clinic for staple removal.  Si las grapas fueron sacados antes de que salir del hospital, los Steri-Strips necesitan quedar por siete a diez dias.  Despues de [employer], por favor quitelas.  Si las grapas [employer], por favor siga las instrucciones para su cita en la clinica para quitarlas.      Special Instructions, Comments, and Handouts (Instrucciones especiales, comentarios, y folletos)    -- Review "Caring for Your Baby and Yourself" booklet.      Repase el folleto "Como cuidarse a usted [employer]  y atender a su bebe"  -- For questions about breastfeeding, call [phone].      Llame si tiene preguntas [employer] [phone].      Birth control:    If you are thinking about having an IUD or Nexplanon placed at your 6 week visit, please let the schedulers know when you make your appointment so that the insurance authorization can be placed beforehand.       Follow Up Appointment (Cita de seguimineto)  Appointment / cita:  When / cuando:    6 weeks postpartum / 6 semanas postparto  Date and Time / fecha y hora:  Call to schedule / [place] para arreglar    Call your clinic to arrange a follow up appointment For 6 weeks postpartum   [place] [phone]   [employer] [phone]   [facility] [phone]   [place] [phone]   [place] [phone]    It is very important for you to keep a current medication list with you in order to assist your doctors with your medical care.   Bring this After Visit Summary with you to your follow up appointments.    Additional Discharge Information (if applicable)    - Continue taking your prenatal vitamins for 6 weeks  - Continue taking the stool softener (colace) twice a day as needed to keep your stool soft    - If you had anemia during the postpartum period: continue taking iron supplementation for 6 weeks   - If you have diabetes: please continue to check your blood sugars and call them into the diabetes educators. They will help you adjust insulin/medications appropriately.   - If you had gestational diabetes: you will be asked to repeat your 2 hour glucose test at your 6 week visit   - If you were taking thyroid medication during your pregnancy: your thyroid levels will be repeated at your 6 week visit to determine if adjustments need to be made.

## 2016-08-24 NOTE — Discharge Summary (Signed)
Postpartum Discharge Summary    Admission Date: 08/20/2016     Discharge Date: 08/25/2016    Patient Name: Theresa Pollard     Principal Diagnosis (required): Intrauterine pregnancy      Hospital Problem List (required):  Active Hospital Problems    Diagnosis    *Intrauterine pregnancy [Z34.90]    Follow up [Z09]    Amniotic fluid index borderline low [O28.8]    Obesity (BMI 40) [E66.9]    Patient consented for MOMPOD Study (IRB [phone]) [Z00.6]    AMA (advanced maternal age) multigravida 25+ [O09.529]    Need for MMR vaccine [Z23]    Prediabetes [R73.03]      Resolved Hospital Problems    Diagnosis    High-risk pregnancy supervision [O09.90]        Principal Procedure During This Hospitalization (required):   Primary low transverse cesarean section    Other Procedures Performed During This Hospitalization (required):  Bilateral tubal ligation    Consultations Obtained During This Hospitalization:  Anesthesia Critical Care    Reason for Admission to the Hospital / History of Present Illness:  The patient is a 36 year old G59P5005 female admitted for IOL due to [facility].    Hospital Course (required):  36 year old G5P5005 at [redacted]w[redacted]d delivered a   Female  at   via C-Section, Low Transverse  at 08/21/2016  12:37 PM .  Weight: 3460 g (7 lb 10.1 oz) .  Apgars: 1 min 7 ; 5 min 9   Laceration type:  Degree: None      Patient underwent primary LTCS with BTL, left ovarian cystectomy for AOD and Cat 2 FHT remote from delivery at 369w1dPostoperative course complicated by endometritis diagnosed by tachycardia in PACU and leukocytosis of 23 with concern for uterine infection during labor.     She used the following for pain relief during labor: CLE converted to GETA.   Labor complications:   As noted above, there was concern for uterine infection based on tachycardia and elevated WBC.   Antepartum issues:    #. Obesity - BMI 46.   #. MOMPOD trial - Contacted research coordinator KaAgustina Caroliia epic message and paged Dr. RaNelva Bushon admission.  #. 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 Taking Metformin 1000 mg BID, NPH 16 + Humalog 16 w/ breakfast, Humalog 14 with lunch, Humalog 62 and NPH 66 at bedtime.   #. Rubella non-immune   #. Rh negative - s/p RhoGam 06/09/16    Her post-delivery course was remarkable for endometritis based on tachycardia postop with suspected intrapartum chorio (fetal tachycardia). Maternal Tmax was 100.2 at 1655 on 4/19. Pt tachycardic, improved from PACU but persistent once on the floor, has improved somewhat since. EKG showed sinus tachycardia. Patient asymptomatic. She was treated with gent/clinda x 24 hours postpartum, leukocytosis normalized and vitals remained stable.    She had QBL of 2L during labor with expected Hgb drop that normalized at time of discharge. She was discharged with iron.    Patient had GDMA2 vs T2DM. Postpartum plan per CDE: Metformin 500 BID was restarted on discharge per plan set during prenatal care.    The patient is feeding Breastmilk.   Her contraception plan consists of tubal ligation.  Immunizations given post-partum: RhoGAM.  The patient was instructed to maintain pelvic rest for six weeks -- including no tampon insertion, douching, or sexual intercourse. She was informed that she may shower as usual. She was instructed to  return to the Emergency Department or to contact her healthcare provider immediately for any symptoms of nausea, vomiting, fevers, wound separation, wound drainage, heavy vaginal bleeding, or pain.    Discharge Condition (required): Recovering post-partum.    Discharge Diet: Regular    Discharge Medications:     What To Do With Your Medications      START taking these medications       Add'l Info    docusate sodium 250 MG capsule   Commonly known as:  COLACE   Take 1 capsule (250 mg) by mouth 2 times daily.    Quantity:  60 capsule   Refills:  0       ferrous sulfate 324 (65 FE) MG Tbec   Take 1 tablet (324 mg) by mouth 2 times daily.    Quantity:  90 tablet      Refills:  0       HYDROcodone-acetaminophen 5-325 MG tablet   Commonly known as:  NORCO   Take 1 tablet by mouth every 6 hours as needed for Moderate Pain (Pain Score 4-6).    Quantity:  20 tablet   Refills:  0       ibuprofen 600 MG tablet   Commonly known as:  MOTRIN   Take 1 tablet (600 mg) by mouth every 6 hours as needed for Mild Pain (Pain Score 1-3).    Quantity:  45 tablet   Refills:  0       metFORMIN 500 MG XR tablet   Commonly known as:  GLUCOPHAGE XR   Take 1 tablet (500 mg) by mouth 2 times daily.    Quantity:  90 tablet   Refills:  0            Where to Get Your Medications      These medications were sent to [facility]  [address], [place] [address]    Hours:  Mon-Fri 8:30am-7:00pm, Sat-Sun 9am-5:00pm Phone:  [phone]     docusate sodium 250 MG capsule    ferrous sulfate 324 (65 FE) MG Tbec    ibuprofen 600 MG tablet    metFORMIN 500 MG XR tablet         Please check with staff for printed prescription or if prescription was faxed to your pharmacy.     Bring a paper prescription for each of these medications     HYDROcodone-acetaminophen 5-325 MG tablet             No Known Allergies    Discharge Disposition: Home.    Follow Up Appointments:  The patient will follow up in four to six weeks with [facility] or her prenatal care provider.Marland Kitchen  Appointment scheduled.    Discharging Provider's Contact Information:   [facility] operator at [phone]

## 2016-08-24 NOTE — Progress Notes (Signed)
POSTPARTUM NOTE  Theresa Pollard is a 36 year old G5P5005 currently POD3 s/p pLTCS, BTL, left ovarian cystectomy  for AOD and Cat 2 FHT remote from delivery at [redacted]w[redacted]d Postoperative course complicated by endometritis diagnosed by tachycardia in PACU and leukocytosis of 23 with concern for uterine infection during labor.     Pregnancy Significant For:   #. Obesity - BMI 46.   #. MOMPOD trial - Contacted research coordinator KAgustina Carolivia epic message and paged Dr. RNelva Bushon admission.  #. 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 Taking Metformin 1000 mg BID, NPH 16 + Humalog 16 w/ breakfast, Humalog 14 with lunch, Humalog 62 and NPH 66 at bedtime.   #. Rubella non-immune   #. Rh negative - s/p RhoGam 06/09/16    Method of Delivery: pLTCS  Estimated Blood Loss: 20039m   Date of birth: 08/21/2016 Time of birth: 12:37 PM Gestational Age:22 1/7     SUBJECTIVE  Patient reports feeling well this morning. Has mild pain with rising from bed, but overall well controlled. Breastfeeding going well. Feels ready for discharge today.     Ambulation: yes  Void Spontaneous: yes  Pain Score: 5  Lochia: minimal, less than menses  Breastfeeding: yes, no issues  Diet: Diet Regular Beginning on POD#1. , without nausea or vomiting  Flatus: yes, has had BM  Other: Denies shortness of breath, dizziness, headache, vision changes, right upper quadrant pain.    OBJECTIVE    Temperature:  [98.1 F (36.7 C)-99 F (37.2 C)] 98.5 F (36.9 C) (04/22 0130)  Blood pressure (BP): (115-129)/(65-82) 129/82 (04/22 0130)  Heart Rate:  [90-105] 90 (04/22 0130)  Respirations:  [16-18] 16 (04/22 0245)  Pain Score: 5 (04/22 0151)  Afebrile, normotensive. Tachycardic in 100s, as high as 111. HR 90 on most recent check.      General: NAD, pleasant  Heart: normal rate ~90s on exam, regular rhythm; no murmurs, rubs, or gallops  Lungs: clear to ascultation; no increased work of breathing  Abdomen: Positive bowel sounds, minimally tender without rebound or  guarding  Fundus: firm fundus 1cm below umbilicus. No fundal tenderness.   Incision: clean/dry/intact; steri strips in place  Perineum: moderate swelling, non tender  Extremities: 2+ edema in feet, symmetric  Neurology: 1+ reflexes, no clonus    Medications:  PRNs in past 12hrs  Acetaminophen 65032m 1  Oxycodone 3m40m1  Oxycodone 5mg 77m   Urine output: Voiding spontaneously, no longer strictly recorded but previously adequate. Pt reports voiding regularly.     LABS  Lab Results   Component Value Date    WBC 9.9 08/23/2016    RBC 2.73 08/23/2016    HGB 8.3 08/23/2016    HCT 24.1 08/23/2016    MCV 88.3 08/23/2016    MCHC 34.4 08/23/2016    RDW 14.4 08/23/2016    PLT 169 08/23/2016    MPV 11.3 08/23/2016     Recent Labs      08/23/16   0921  08/23/16   1320  08/23/16   1930  08/23/16   2113   GLUCPOCT  171*  183*  176*  166*       Antepartum EDS: 1  Postpartum EDS: not indicated    ASSESSMENT/PLAN  EloisFRANKEE GRITZ 35 ye37 old G5P5005 currently POD3 s/p pLTCS, BTL, left ovarian cystectomy  for AOD and Cat 2 FHT remote from delivery at 66w1d63w1doperative course complicated by  endometritis diagnosed by tachycardia in PACU and leukocytosis of 23 with concern for uterine infection during labor.     #. Post-operative day 3: meeting milestones   -- Hgb 12.1->11.6->10.8-->9.4-->8.3. Overall expected drop in hemoglobin given EBL 2046m. Plan to d/c with iron and colace.   -- encourage safe ambulation as tolerated  -- support lactation    #. Endometritis: Tachycardia postop with suspected intrapartum chorio (fetal tachycardia). Maternal tmax to 100.2 at 1655 on 4/19. Pt tachycardic, improved from PACU but persistent once on the floor, has improved somewhat since. EKG sinus tachycardia. Patient asymptomatic.  - has remained afebrile, now with normal WBC.   - s/p gent/clinda x 24hr postpartum (never febrile)    #. Obesity - BMI 46.   - lovenox held in context of QBL 2L  - receiving gent/clinda x 24hr for endometritis,  will not get keflex/flagyl    #. MOMPOD trial - Contacted research coordinator KAgustina Carolivia epic message and paged Dr. RNelva Bushon admission.  - Baby has been measured by Dr. RNelva Bush   #. 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 Taking Metformin 1000 mg BID, NPH 16/66, Humalog 16/14/62 antepartum.   - FSG 124-180 PP on regular diet  - Postpartum plan per CDE: Metformin 500 BID, to be restarted on discharge    #. Zika virus exposure - neg testing qtrimester  - placenta to path    #. Rh negative - s/p RhoGam 06/09/16  - KB neg, baby Rh POS  - s/p rhogam 0545 on 4/20    #. Breast Feeding: yes. Seen by lactation 4/21.   #. Birth Control Method: s/p BTL and ovarian cystectomy, added to triage f/u list ; counseled with regard to pelvic rest and interpregnancy spacing  #. Prenatal labs: O NEG, Rubella non-immune, quant neg. Given MMR.  #. Health Care Maintenance: Influenza up to date, Tdap up to date  #. Disposition: Anticipate discharge home today  #. Follow up: 6 week    Note written in conjunction with BMardelle Matte MBallantine PGY-1  Family Medicine    Attending  POD 3 pLTCS, BTL and ovarian cystectomy for arrest of descent.  PP course c/b endometritis.  Pregnancy c/b GDM, obesity.  Patient seen.  Pain controlled.  Ambulating and voiding.  AVSS, HR 90  Exam appropriate.  Post op Hb 8.3  Glucose 166-183    Clinically stable.  She is s/p course of IV abx for PPE.  Meeting milestones and ready for discharge.  She will continue metformin 500 mg bid.  Instructions and precautions reviewed by team.  She will follow up in 6 weeks.     CLianne BushyMD

## 2016-08-25 LAB — GLUCOSE (POCT)
Glucose (POCT): 119 mg/dL — ABNORMAL HIGH (ref 70–99)
Glucose (POCT): 251 mg/dL — ABNORMAL HIGH (ref 70–99)
Glucose (POCT): 164 mg/dL — ABNORMAL HIGH (ref 70–99)

## 2016-08-25 NOTE — Plan of Care (Signed)
Problem: Fluid Volume Imbalance, Risk of  Goal: Absence of postpartum hemorrhage  Medication management may include oxytocin (for prevention, also), Methergine (avoid if hypertensive), prostaglandin.   Outcome: Goal Met      Problem: Infection Risk  Goal: Absence of infection signs and symptoms  Outcome: Goal Met      Problem: Pain - Acute  Goal: Reduced pain sensation  Outcome: Goal Met      Problem: Discharge Planning  Goal: Able to care for self, ambulate independently and adequate nutritional/ fluid intake  Outcome: Adequate for discharge.    Goal: Demonstrate knowledge of infant care and infant safety  Outcome: Adequate for discharge.    Goal: Demonstrate infant feeding and parent-infant attachment behavior  Outcome: Adequate for discharge.    Goal: Demonstrate knowledge of discharge instructions  Outcome: Adequate for discharge.

## 2016-08-25 NOTE — Interdisciplinary (Signed)
All discharge education completed with mother at this time and she verbalized understanding of all discharge teaching, prescriptions, and follow up appointments. Mother plans to board in NICU tonight, declined wheelchair. Patient ambulated from unit in stable condition, accompanied by writer at 1430 with all belongings, prescriptions and paperwork in hand.

## 2016-08-25 NOTE — Interdisciplinary (Signed)
Page to OB:     Carolan Shiver - pt in room 1025 - did she need an AM CBC? I see a note that she needed one before discharge. Thank you.

## 2016-08-25 NOTE — Interdisciplinary (Signed)
Page to OB:     Theresa Pollard - pt in room 1025 - does she need a fasting blood sugar? Thank you.

## 2016-08-25 NOTE — Plan of Care (Signed)
Problem: Fluid Volume Imbalance, Risk of  Goal: Absence of postpartum hemorrhage  Medication management may include oxytocin (for prevention, also), Methergine (avoid if hypertensive), prostaglandin.   Outcome: Progressing toward goal, anticipate improvement over: next 12-24 hours  Fundus firm and below umbilicus. Bleeding is scant, rubra.     Problem: Infection Risk  Goal: Absence of infection signs and symptoms  Outcome: Progressing toward goal, anticipate improvement over: next 12-24 hours  No s/sx of infection. VSS. Will continue to assess.     Problem: Pain - Acute  Goal: Reduced pain sensation  Outcome: Progressing toward goal, anticipate improvement over: next 12-24 hours  Pt is meeting pain goal. Administering pain medication as scheduled and prn. Will continue to monitor.

## 2016-08-25 NOTE — Progress Notes (Addendum)
POSTPARTUM NOTE  Theresa Pollard is a 36 year old G5P5005 currently POD4 s/p pLTCS, BTL, left ovarian cystectomy  for AOD and Cat 2 FHT remote from delivery at [redacted]w[redacted]d Postoperative course complicated by endometritis diagnosed by tachycardia in PACU and leukocytosis of 23 with concern for uterine infection during labor.     Pregnancy Significant For:   #. Obesity - BMI 46.   #. MOMPOD trial - Contacted research coordinator KAgustina Carolivia epic message and paged Dr. RNelva Bushon admission.  #. 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 Taking Metformin 1000 mg BID, NPH 16 + Humalog 16 w/ breakfast, Humalog 14 with lunch, Humalog 62 and NPH 66 at bedtime.   #. Rubella non-immune   #. Rh negative - s/p RhoGam 06/09/16    Method of Delivery: pLTCS  Estimated Blood Loss: 20023m   Date of birth: 08/21/2016 Time of birth: 12:37 PM Gestational Age:23 1/7     SUBJECTIVE  Patient reports feeling tired this AM, somewhat sore from walking up and down to NICU but overall pain well-controlled.    Ambulation: yes  Void Spontaneous: yes  Pain Score: 4  Lochia: minimal, less than menses  Breastfeeding: yes, no issues  Diet: Diet Regular Beginning on POD#1. , without nausea or vomiting  Flatus: yes, has had BM  Other: Denies shortness of breath, dizziness, headache, vision changes, right upper quadrant pain.    OBJECTIVE  Temperature:  [97.8 F (36.6 C)-98.7 F (37.1 C)] 98.7 F (37.1 C) (04/23 0340)  Blood pressure (BP): (104-131)/(61-78) 121/71 (04/23 0340)  Heart Rate:  [84-96] 94 (04/23 0340)  Respirations:  [16] 16 (04/23 0340)  Pain Score: 4 (04/23 0348)  Afebrile, normotensive, not tachycardic.    General: NAD, pleasant  Heart: normal rate, regular rhythm; no murmurs, rubs, or gallops  Lungs: clear to ascultation; no increased work of breathing  Abdomen: Positive bowel sounds, minimally tender without rebound or guarding  Fundus: fundus difficult to palpate due to habitus, firm ~1 cm below umbilicus, no fundal tenderness.      Incision: clean/dry/intact; steri strips in place  Perineum: moderate swelling, non tender  Extremities: 2+ edema in feet, symmetric  Neurology: 1+ reflexes, no clonus    Medications:  PRNs in past 12hrs  Oxycodone '10mg'$  x 1    Urine output: Voiding spontaneously, no longer strictly recorded but previously adequate. Pt reports voiding regularly.     LABS  Lab Results   Component Value Date    WBC 8.3 08/24/2016    RBC 2.96 08/24/2016    HGB 8.8 08/24/2016    HCT 26.3 08/24/2016    MCV 88.9 08/24/2016    MCHC 33.5 08/24/2016    RDW 14.3 08/24/2016    PLT 198 08/24/2016    MPV 11.7 08/24/2016     Recent Labs      08/24/16   1040  08/24/16   1546  08/24/16   2002  08/24/16   2244   GLUCPOCT  213*  211*  161*  127*       Antepartum EDS: 1  Postpartum EDS: not indicated    ASSESSMENT/PLAN  Theresa Pollard a [age]ear old G5P5005 currently POD3 s/p pLTCS, BTL, left ovarian cystectomy  for AOD and Cat 2 FHT remote from delivery at 3929w1dostoperative course complicated by endometritis diagnosed by tachycardia in PACU and leukocytosis of 23 with concern for uterine infection during labor.     #. Post-operative day 4:  meeting milestones--ambulating, voiding spontaneously, tolerating PO, breastfeeding  -- Hgb 12.1->11.6->10.8-->9.4-->8.3 --> 8.8 yesterday. Overall expected drop in hemoglobin given EBL 2081m, has now stabalized. Plan to d/c with iron and colace.   -- encourage safe ambulation as tolerated  -- support lactation    #. Endometritis: Tachycardia postop with suspected intrapartum chorio (fetal tachycardia). Maternal tmax to 100.2 at 1655 on 4/19. Pt tachycardic, improved from PACU but persistent once on the floor, now resolved EKG sinus tachycardia. Patient asymptomatic.  - has remained afebrile, now with normal WBC, not tachycardic  - s/p gent/clinda x 24hr postpartum (never febrile)    #. Obesity - BMI 46.   - lovenox held in context of QBL 2L  - receiving gent/clinda x 24hr for endometritis, will not get  keflex/flagyl    #. MOMPOD trial - Contacted research coordinator KAgustina Carolivia epic message and paged Dr. RNelva Bushon admission.  - Baby has been measured by Dr. RNelva Bush   #. 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 Taking Metformin 1000 mg BID, NPH 16/66, Humalog 16/14/62 antepartum. Fasting AM glucose yesterday 125, throughout the day 125-213, received 6 units of insulin.  - continue FSG and ISS until discharge  - Postpartum plan per CDE: Metformin 500 BID, to be restarted on discharge    #. Zika virus exposure - neg testing qtrimester  - placenta to path    #. Rh negative - s/p RhoGam 06/09/16  - KB neg, baby Rh POS  - s/p rhogam 0545 on 4/20    #. Breast Feeding: yes. Seen by lactation 4/21.   #. Birth Control Method: s/p BTL and ovarian cystectomy, added to triage f/u list ; counseled with regard to pelvic rest and interpregnancy spacing  #. Prenatal labs: O NEG, Rubella non-immune, quant neg. Given MMR.  #. Health Care Maintenance: Influenza up to date, Tdap up to date  #. Disposition: Anticipate discharge home tomorrow if baby stays in NICU, otherwise today  #. Follow up: 6 week    ERico Ala PGY-1  Family Medicine    R4 Addendum:  Agree with the note above with appropriate changes made.  DC home today.    ACharise Carwin MD, PGY-4 PID [id number] Derived From Controlled Access Password, August 25, 2016, 7:12 AM    Attending:  Patient is postoperative day 4 status post primary low transverse cesarean section, bilateral tubal ligation and ovarian cystectomy remarkable for gestational diabetes A2 vs type 2 diabetes mellitus, Rh neg, obesity and zika exposure. No acute issues. Meeting milestones. On metformin and insulin. Status post rhogam. Agree with plan as outlined. Stable for discharge today.  EEzra Sites MD

## 2016-08-25 NOTE — Lactation Note (Signed)
This note was copied from a baby's chart.  Mother stated feeling engorged, baby currently in NICU and was able to feed baby for 20 minutes on each breast and notes breasts feeling softer.  Discussed using pump on extraction mode (first button) if needed to pump and using cold compresses before and after feeding.  Mother only needs to pump if baby is not going well to breast and plan is for baby to be discharged tomorrow from NICU.  Mother rooming in with baby in NICU.

## 2016-08-28 NOTE — Procedures (Signed)
FINAL PATHOLOGIC DIAGNOSIS:  A: Fallopian tube, right, tubal ligation       -Complete cross-sections of benign fallopian tube.  B: Fallopian tube, left, tubal ligation       -Complete cross-sections of benign fallopian tube.  C: Ovary, left cyst, cystectomy       -Hemorrhagic corpus luteal cyst.  Comment: This case was reviewed intradepartmentaly.    SPECIMEN(S) SUBMITTED:  A: right fallopian tube segment  B: left fallopian tube segment  C: left ovarian cyst    CLINICAL HISTORY:  Status post intrauterine pregnancy. Cyst removal, suspect hemorrhagic  corpus luteum cyst    GROSS DESCRIPTION:  A: The specimen (received in formalin, labeled with the patient's name,  medical record number and "right") consists of a serosally covered tubular  soft tissue segment measuring 2.0 cm in length x 0.8 cm in diameter. The  serosa is tan-purple and free of adhesions. The specimen is serially  sectioned to reveal a well-defined lumen. Representative full thickness  cross sections are submitted in cassette A1.  B: The specimen (received in formalin, labeled with the patient's name,  medical record number and "left") consists of a serosally covered tubular  soft tissue segment measuring 2.0 cm in length x 1.0 cm in diameter. The  serosa is tan-purple and free of adhesions. The specimen is serially  sectioned to reveal a well-defined lumen. Representative full thickness  cross sections are submitted in cassette B1.  C: The specimen (received in formalin, labeled with the patient's name,  medical record number and "L ovary cyst") consists of a firm, irregular  cystic structure measuring 1.9 x 1.7 x 1.0 cm. The external surface is  inked black. The specimen is serially sectioned to reveal a tan-brown cut  surface. No masses or lesions are identified on the cut surface.  Representative sections are submitted in cassette C1.  JH/jb  CONFIDENTIAL HEALTH INFORMATION: Health Care information is personal and  sensitive information. If it is  being faxed to you it is done so under  appropriate authorization from the patient or under circumstances that do  not require patient authorization. You, the recipient, are obligated to  maintain it in a safe, secure and confidential manner. Re-disclosure  without additional patient consent or as permitted by law is prohibited.  Unauthorized re-disclosure or failure to maintain confidentiality could  subject you to penalties described in federal and state law.  If you have  received this report or facsimile in error, please notify the [facility] immediately and destroy the received document(s).    Material reviewed and Interpreted and  Report Electronically Signed by:  Eldridge Dace M.D. [phone])  Attending Surgical Pathologist  08/28/16 15:04  Electronic Signature derived from a single  controlled access password

## 2016-09-04 NOTE — Procedures (Signed)
FINAL PATHOLOGIC DIAGNOSIS:  A: Uterus, placenta, cesarean section       -Mature placenta.       -Chorioamnionitis.       -Chorionic plate vasculitis.       -Normoblastemia.  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  Samples of this placenta were  collected for IRB [id number], following patient's consent.  GROSS DESCRIPTION:   A: Received without fixative labeled with the  patient's name, medical record number and "placenta" is a singleton,  discoid placenta.  Umbilical cord:     Length:  51.4 cm            Diameter:  1.5 cm            Twist: Slight            Number of vessels: 3            Insertion: Eccentric            Other:  None  Membranes:     Complete            Insertion:  Marginal            Color: Clear            Separation of amnion from chorion: No  Disc measurement:    19.9 x 19.7 x 2.0 cm  Disc weight (with cord and membranes removed):     504 grams  Fetal surface:      Purple and translucent  Maternal surface:   Intact                 Cut section:  Dark red, friable, calcified                 Calcifications:  Marked                 Retroplacental hematomas: Possible single hematoma, marginal  (4.0 x 3.5 x 0.5 cm)                 Infarcts:  None                 Intervillous thrombi: None  Additional findings:     None  Representative sections are submitted as follows:  A1:  Umbilical cord and membrane roll  A2:  Placenta disc at cord insertion site, full thickness section  A3:  Normal placental disc, full thickness section  A4:  Normal placental disc, full thickness section  A5:  Representative section showing possible retroplacental hematoma and  calcification  JH/ma  CONFIDENTIAL HEALTH INFORMATION: Health Care information is personal and  sensitive information. If it is being faxed to you it is done so under  appropriate authorization from the patient or under circumstances that  do  not require patient authorization. You, the recipient, are obligated to  maintain it in a safe, secure and confidential manner. Re-disclosure  without additional patient consent or as permitted by law is prohibited.  Unauthorized re-disclosure or failure to maintain confidentiality could  subject you to penalties described in federal and state law.  If you have  received this report or facsimile in error, please notify the [facility]  [facility] Department immediately and destroy the received document(s).    Material reviewed and Interpreted and  Report Electronically Signed by:  Jackalyn LombardMana M Parast M.D., PhD [phone]([id number])  Attending Surgical Pathologist  09/04/16  12:13  Electronic Signature derived from a single  controlled access password

## 2016-09-08 LAB — ECG 12-LEAD
ATRIAL RATE: 117 {beats}/min
P AXIS: 37 degrees
PR INTERVAL: 130 ms
QRS INTERVAL/DURATION: 72 ms
QT: 316 ms
QTC INTERVAL: 440 ms
R AXIS: 10 degrees
T AXIS: 9 degrees
VENTRICULAR RATE: 117 {beats}/min

## 2016-09-17 LAB — EMMI , DIABETES - CHECKING YOUR BLOOD SUGAR: EMMI Video Order Number: 11983044329

## 2016-10-01 ENCOUNTER — Encounter (HOSPITAL_BASED_OUTPATIENT_CLINIC_OR_DEPARTMENT_OTHER): Payer: Self-pay | Admitting: Obstetrics & Gynecology

## 2016-10-01 ENCOUNTER — Ambulatory Visit: Payer: Medicaid Other | Attending: Obstetrics & Gynecology | Admitting: Obstetrics & Gynecology

## 2016-10-01 VITALS — BP 118/70 | HR 82 | Temp 98.1°F | Ht <= 58 in | Wt 169.0 lb

## 2016-10-01 DIAGNOSIS — O24419 Gestational diabetes mellitus in pregnancy, unspecified control: Principal | ICD-10-CM | POA: Insufficient documentation

## 2016-10-01 DIAGNOSIS — Z23 Encounter for immunization: Secondary | ICD-10-CM | POA: Insufficient documentation

## 2016-10-01 DIAGNOSIS — R7303 Prediabetes: Secondary | ICD-10-CM | POA: Insufficient documentation

## 2016-10-01 DIAGNOSIS — E119 Type 2 diabetes mellitus without complications: Secondary | ICD-10-CM

## 2016-10-01 MED ORDER — METFORMIN HCL 500 MG OR TB24
500.0000 mg | ORAL_TABLET | Freq: Two times a day (BID) | ORAL | 3 refills | Status: AC
Start: 2016-10-01 — End: 2016-11-15

## 2016-10-01 MED ORDER — PRENATAL VITAMIN 27-0.8 MG PO TABS
1.0000 | ORAL_TABLET | Freq: Every day | ORAL | 3 refills | Status: DC
Start: 2016-10-01 — End: 2017-02-24

## 2016-10-01 NOTE — Progress Notes (Signed)
POST-PARTUM VISIT NOTE    Theresa Pollard is a 36 year old G5P5005 presenting for her 6-wk post-partum visit.    Date of birth: 08/21/2016   Time of birth: 12:37 PM   Gestational Age:61 1/7   Delivery mode: pLTCS with BTL, left ovarian cystectomy for AOD and Cat 2 FHT remote from delivery at [redacted]w[redacted]d.  The infant was a  viable Female  delivered on 08/21/2016  12:37 PM .  Weight: 122.05  gms. Apgars: 1 min 7 ; 5 min 9    Perineum at Delivery: perineum intact    Intrapartum/Postpartum Problems:   #. Endometritis: Tachycardia postop with suspected intrapartum chorio (fetal tachycardia). Maternal tmax to 100.2 at 1655 on 4/19. Pt tachycardic, improved from PACU but persistent once on the floor, resolved with EKG sinus tachycardia. Patient asymptomatic. S/p gent/clinda x 24hr postpartum (never febrile)    #. 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 Antepartum: Metformin 1000 mg BID, NPH 16/66, Humalog 16/14/62 antepartum. Postpartum plan per CDE: Metformin 500 BID    #. Rh negative - s/p RhoGam 08/22/16    SUBJECTIVE:  Patient reports doing overall well. She has been checking her BG "when she remembers" and has had elevated FSG to 130 and postprandial BG 120-160. On metformin 500mg  BID.    Acute concerns: none  Newborn concerns: none. Baby is doing well with regular follow up with a pediatrician.     PROBLEMS/CONCERNS: Pt denies concerns regarding her breasts or breast feeding, and no concerns regarding her perineum, including vaginal discharge/bleeding. Her bowels are moving normally, and her incision is healing well. She denies signs of infection including fevers/chills, no nausea/vomiting, chest pain, or shortness of breath.    MATERNAL STATUS:  Normal Activities Resumed: yes  Depression: denies  Safety Concerns: denies  Menses Resumed: no  Coitus Resumed: no      OB History   Gravida Para Term Preterm AB Living   5 5 5  0 0 5   SAB TAB Ectopic Multiple Live Births   0 0 0 0 5      # Outcome Date GA Lbr Len/2nd Weight Sex  Delivery Anes PTL Lv   5 Term 08/21/16 [redacted]w[redacted]d 276:25 / 03:12 3460 g (7 lb 10.1 oz) M CS-LTranv EPI,Gen N LIV   4 Term      Vag-Spont   LIV   3 Term      Vag-Spont   LIV   2 Term      Vag-Spont   LIV   1 Term      Vag-Spont   LIV      Obstetric Comments   Normal pap 2017.   No history of abnormal paps.    No GC/CT.    Irregular menses. Firm LMP.    GBS with prior pregnancies: no       Past Medical History:   Diagnosis Date    Obesity     Prediabetes        Past Surgical History:   Procedure Laterality Date    CARPAL TUNNEL RELEASE      CHOLECYSTECTOMY         Lab Results   Component Value Date    WBC 8.3 08/24/2016    RBC 2.96 (L) 08/24/2016    HGB 8.8 (L) 08/24/2016    HCT 26.3 (L) 08/24/2016    MCV 88.9 08/24/2016    MCHC 33.5 08/24/2016    RDW 14.3 (H) 08/24/2016    PLT 198 08/24/2016  MPV 11.7 08/24/2016       Review of Systems: as per HPI    OBJECTIVE:    VITALS: LMP 11/21/2015 (Exact Date)    Physical Exam:  General Appearance: Pt in no apparent distress, well developed and well nourished  Heart:: regular rate and rhythm,  no murmurs appreciated   Lungs: chest clear to auscultation bilaterally, no wheezing/crackles  Breast: normal, no obvious masses, erythema, or discharge  Abdomen: normal bowel sounds, soft, non-tender, no guarding or rebound  Extremities Normal, no cyanosis, clubbing, or edema  Pelvic Exam: EGBUS within normal limits, normal cervix without lesions, polyps or tenderness, uterus normal size, shape, consistency, no mass or tenderness, adnexa normal in size without mass or tenderness.  Perineum: perineum intact  Incision: clean, dry, intact and well healed     EDS:  EDS today: EDS Score (Calculated): 0    ASSESSMENT/PLAN:     # Post-Partum Course: normal post-partum course, no concerns   # GDMA2 vs. T2DM: 2hr GTT ordered. [facility] follow [facility]. Will continue on metformin 500mg  BID until able to f/u with PCP.   # Contraception Method: BTL  # Health  Care Maintenance: TDap UTD, PAP negative for intraepithelial lesion or malignancy 03/2016    POSTPARTUM TEACHING: Family Planning, Kegels and Resumption of normal activity    Pt seen, evaluated, and discussed with Dr. Hyacinth MeekerMiller.    Doyce LooseEmily W. Jessina Marse, MD [phone]  Resident Physician, PGY-1  Department of Reproductive Medicine  Pager: [phone]

## 2016-10-01 NOTE — Progress Notes (Signed)
Attending Note:  Patient presents for PP exam.  She is s/p c/s, BTL, ovarian cystectomy.  Pregnancy c/b GDM.    Subjective:  Patient interviewed and examined with Dr. Eual FinesNgan.  I reviewed the resident's history and agree with the findings with the following additions: none  Review of Systems (ROS): As per the resident's note.  Past Medical, Family, Social History:  As per the resident's note.    Objective:   I have examined the patient. I agree with the resident's exam.  I agree with the resident's assessment and plan.     Jaynie Crumblehristine B Ryana Montecalvo

## 2017-02-24 ENCOUNTER — Other Ambulatory Visit (HOSPITAL_BASED_OUTPATIENT_CLINIC_OR_DEPARTMENT_OTHER): Payer: Self-pay | Admitting: Obstetrics & Gynecology

## 2017-02-24 NOTE — Telephone Encounter (Signed)
Fax request Rcvd for RF of [facility]renatal [facility] from [facility]    Last fill 10/01/16  Rx pended.   Routing to Dr Eual FinesNgan

## 2017-02-25 MED ORDER — PRENATAL VITAMIN 27-0.8 MG PO TABS
1.0000 | ORAL_TABLET | Freq: Every day | ORAL | 3 refills | Status: AC
Start: 2017-02-25 — End: ?

## 2017-04-08 ENCOUNTER — Other Ambulatory Visit (HOSPITAL_BASED_OUTPATIENT_CLINIC_OR_DEPARTMENT_OTHER): Payer: Self-pay | Admitting: Gynecology

## 2017-04-08 DIAGNOSIS — O24429 Gestational diabetes mellitus in childbirth, unspecified control: Principal | ICD-10-CM

## 2017-04-08 MED ORDER — GLUCOSE BLOOD VI STRP
1.00 | ORAL_STRIP | Freq: Every day | 0 refills | Status: AC
Start: 2017-04-08 — End: ?

## 2017-04-08 NOTE — Telephone Encounter (Signed)
Requested Prescriptions     Pending Prescriptions Disp Refills    glucose blood (CONTOUR NEXT TEST) test strip 1500 strip 0     Sig: 1 strip by Other route 5 times daily.       [facility]: [email or web address]    Last Office Visit: 10/01/2016  Next Office Visit: Visit date not found  Refill auth request received via fax for above with 1500 test strips dispensed.  Routed to MD for review.

## 2017-04-15 ENCOUNTER — Other Ambulatory Visit (HOSPITAL_BASED_OUTPATIENT_CLINIC_OR_DEPARTMENT_OTHER): Payer: Self-pay | Admitting: Gynecology

## 2017-04-15 DIAGNOSIS — E119 Type 2 diabetes mellitus without complications: Principal | ICD-10-CM

## 2017-04-15 MED ORDER — LANCETS MISC
1.00 | Freq: Every day | 6 refills | Status: AC
Start: 2017-04-15 — End: ?

## 2017-04-15 NOTE — Telephone Encounter (Signed)
Requested Prescriptions     Pending Prescriptions Disp Refills    lancets 150 Lancet 6     Sig: 1 Lancet by Other route 5 times daily.       [facility]   Last Office Visit: 10/01/2016  Next Office Visit: Visit date not found  Routed to MD for review.

## 2019-08-03 ENCOUNTER — Encounter (INDEPENDENT_AMBULATORY_CARE_PROVIDER_SITE_OTHER): Payer: Self-pay

## 2021-09-12 ENCOUNTER — Ambulatory Visit: Payer: BC Managed Care – PPO | Admitting: Family Medicine

## 2021-09-12 ENCOUNTER — Encounter: Payer: Self-pay | Admitting: Family Medicine

## 2021-09-12 VITALS — BP 118/82 | HR 82 | Ht <= 58 in | Wt 141.4 lb

## 2021-09-12 DIAGNOSIS — B070 Plantar wart: Secondary | ICD-10-CM

## 2021-09-12 DIAGNOSIS — L814 Other melanin hyperpigmentation: Secondary | ICD-10-CM | POA: Insufficient documentation

## 2021-09-12 DIAGNOSIS — F321 Major depressive disorder, single episode, moderate: Secondary | ICD-10-CM | POA: Insufficient documentation

## 2021-09-12 DIAGNOSIS — F3289 Other specified depressive episodes: Secondary | ICD-10-CM

## 2021-09-12 DIAGNOSIS — R7301 Impaired fasting glucose: Secondary | ICD-10-CM

## 2021-09-12 DIAGNOSIS — Z1159 Encounter for screening for other viral diseases: Secondary | ICD-10-CM

## 2021-09-12 DIAGNOSIS — Z114 Encounter for screening for human immunodeficiency virus [HIV]: Secondary | ICD-10-CM

## 2021-09-12 DIAGNOSIS — E119 Type 2 diabetes mellitus without complications: Secondary | ICD-10-CM

## 2021-09-12 DIAGNOSIS — E559 Vitamin D deficiency, unspecified: Secondary | ICD-10-CM

## 2021-09-12 MED ORDER — SERTRALINE HCL 25 MG PO TABS: 25.0000 mg | ORAL_TABLET | Freq: Every day | ORAL | 3 refills | Status: AC

## 2021-09-12 MED ORDER — METFORMIN HCL 500 MG PO TABS: 500.0000 mg | ORAL_TABLET | Freq: Every day | ORAL | 3 refills | Status: DC

## 2021-09-12 NOTE — Assessment & Plan Note (Signed)
Informed patient that this is a benign non-cancerous  rash that does not warrant treatment ?

## 2021-09-12 NOTE — Progress Notes (Signed)
? ?New Patient Office Visit ? ?Subjective:  ?Patient ID: Tonya Knapp, female    DOB: November 05, 1980  Age: 41 y.o. MRN: [id number] ? ?CC:  ?Chief Complaint  ?Patient presents with  ? New Patient (Initial Visit)  ?  Establishing care, previously seen by a provider in [place]. Pt complains of skin spots on arm onset 08/29/21. Also has a spot on her left foot she would like to have looked at onset 08/22/21  ? ? ?HPI ?Tonya Knapp is a 41 y.o. female with past  medical history of type 2 diabetes presenting for establishing care. She moved from [place] to [place] on 02/2021.  ?Rash: she voices concerns about a rash of tiny dots on her arms and face that was noticed two weeks ago, on 08/26/21. She denies itching and pain with the rash. ? Plantar warts: she reports a painful lesion on her inner left foot by the heel bone two weeks ago on 08/26/21. She complains of pain with ambulating since onset. ?Depression: she noted being very depressed since her mother's passing in 02/2021 and relocating from [place] to [place]. She stated that her husband was denied his visa, and she was told that it would be another three years before they could apply. Her husband is in [place]. She shares that being away from her husband, grieving her mother, and raising her two children alone is hard. She reports having no one to talk to at times. She has a 41 year old and a 73-year-old. ?  ? ?History reviewed. No pertinent past medical history. ? ?Past Surgical History:  ?Procedure Laterality Date  ? CESAREAN SECTION N/A 09/25/2017  ? ? ?History reviewed. No pertinent family history. ? ?Social History  ? ?Socioeconomic History  ? Marital status: Single  ?  Spouse name: Not on file  ? Number of children: Not on file  ? Years of education: Not on file  ? Highest education level: Not on file  ?Occupational History  ? Not on file  ?Tobacco Use  ? Smoking status: Every Day  ?  Types: E-cigarettes  ? Smokeless tobacco: Current  ?Substance and  Sexual Activity  ? Alcohol use: Yes  ?  Comment: occasionally  ? Drug use: Not Currently  ? Sexual activity: Not Currently  ?Other Topics Concern  ? Not on file  ?Social History Narrative  ? Not on file  ? ?Social Determinants of Health  ? ?Financial Resource Strain: Not on file  ?Food Insecurity: Not on file  ?Transportation Needs: Not on file  ?Physical Activity: Not on file  ?Stress: Not on file  ?Social Connections: Not on file  ?Intimate Partner Violence: Not on file  ? ? ?ROS ?Review of Systems  ?Constitutional:  Negative for chills, fatigue and fever.  ?HENT:  Negative for rhinorrhea, sinus pressure, sinus pain and sneezing.   ?Eyes:  Negative for pain, redness and itching.  ?Respiratory:  Negative for cough, chest tightness and shortness of breath.   ?Cardiovascular:  Negative for chest pain and palpitations.  ?Gastrointestinal:  Negative for constipation, diarrhea, nausea and vomiting.  ?Endocrine: Negative for polydipsia, polyphagia and polyuria.  ?Genitourinary:  Negative for frequency and urgency.  ?Musculoskeletal:  Negative for back pain, neck pain and neck stiffness.  ?Skin:  Positive for rash and wound.  ?Neurological:  Positive for headaches. Negative for dizziness, tremors, weakness and numbness.  ?Psychiatric/Behavioral:  Negative for confusion, self-injury, sleep disturbance and suicidal ideas.   ? ?Objective:  ? ?Today's Vitals: BP 118/82   Pulse  82   Ht $R'4\' 6"'EQ$  (1.372 m)   Wt 141 lb 6.4 oz (64.1 kg)   LMP 08/21/2021   SpO2 94%   BMI 34.09 kg/m?  ? ?Physical Exam ?Constitutional:   ?   Appearance: She is not toxic-appearing or diaphoretic.  ?HENT:  ?   Head: Normocephalic.  ?   Right Ear: External ear normal.  ?   Left Ear: External ear normal.  ?   Nose: No congestion or rhinorrhea.  ?   Mouth/Throat:  ?   Mouth: Mucous membranes are moist.  ?Eyes:  ?   General:     ?   Right eye: No discharge.     ?   Left eye: No discharge.  ?   Extraocular Movements: Extraocular movements intact.   ?Cardiovascular:  ?   Rate and Rhythm: Normal rate and regular rhythm.  ?   Pulses: Normal pulses.  ?   Heart sounds: Normal heart sounds.  ?Pulmonary:  ?   Effort: Pulmonary effort is normal.  ?   Breath sounds: Normal breath sounds.  ?Abdominal:  ?   Palpations: Abdomen is soft.  ?Musculoskeletal:  ?   Cervical back: No tenderness.  ?   Right lower leg: No edema.  ?   Left lower leg: No edema.  ?Lymphadenopathy:  ?   Cervical: No cervical adenopathy.  ?Skin: ?   Findings: Lesion and rash present.  ?   Comments:  Rash:Small, circumscribed pigmented macule surrounded by normal appearing skin. ? ?Lesion: one 0.3cm papular growth with irregular contour on the posterior medial calcaneous ? ?   ?Neurological:  ?   Mental Status: She is alert and oriented to person, place, and time.  ?Psychiatric:  ?   Comments: Normal affect  ? ? ?Assessment & Plan:  ? ?Problem List Items Addressed This Visit   ? ?  ? Musculoskeletal and Integument  ? Plantar wart of left foot  ?  Referral made to dermatology  ? ?  ?  ? Relevant Orders  ? Ambulatory referral to Dermatology  ?  ? Other  ? [place]  ?  Informed patient that this is a benign non-cancerous  rash that does not warrant treatment ? ?  ?  ? Depression, major, single episode, moderate ([place])  ?  Would like for a patient to speak with a therapist concerning her challenges ?The patient consented, and a referral made ?Started patient was on Zoloft ? ?  ?  ? Relevant Medications  ? sertraline (ZOLOFT) 25 MG tablet  ? ?Other Visit Diagnoses   ? ? Encounter for screening for HIV    -  Primary  ? Relevant Orders  ? Hepatitis C antibody  ? Need for hepatitis C screening test      ? Relevant Orders  ? HIV antibody (with reflex)  ? IFG (impaired fasting glucose)      ? Relevant Orders  ? Hemoglobin A1c  ? Vitamin D deficiency      ? Relevant Orders  ? Vitamin D (25 hydroxy)  ? Type 2 diabetes mellitus without complication, without long-term current use of insulin ([place])      ? Relevant  Medications  ? metFORMIN (GLUCOPHAGE) 500 MG tablet  ? Other Relevant Orders  ? CBC with Differential/Platelet  ? CMP14+EGFR  ? TSH + free T4  ? Lipid panel  ? Depression, major, single episode, mild ([place])      ? Relevant Medications  ? sertraline (ZOLOFT) 25 MG tablet  ?  Other Relevant Orders  ? Ambulatory referral to Psychiatry  ? Plantar wart, left foot      ? ?  ? ? ?Outpatient Encounter Medications as of 09/12/2021  ?Medication Sig  ? metFORMIN (GLUCOPHAGE) 500 MG tablet Take 1 tablet (500 mg total) by mouth daily.  ? sertraline (ZOLOFT) 25 MG tablet Take 1 tablet (25 mg total) by mouth daily.  ? ?No facility-administered encounter medications on file as of 09/12/2021.  ? ? ?Follow-up: Return in about 3 months (around 12/13/2021).  ? ?Alvira Monday, FNP ?

## 2021-09-12 NOTE — Patient Instructions (Addendum)
I appreciate the opportunity to provide care to you today! ? ?  ?Follow up:  3 months ? ?Labs: please stop by the lab today to get your blood drawn (CBC, CMP, TSH, Lipid profile, HgA1c, Vit D) ? ?Screening: HIV and Hep C ?  ?Please pick up your medication at the pharmacy ? ?Referrals today-  dermatology for the plantar wart on your left foot and the psychiatrist. You will be contacted to set up an appt.  ?  ?Please continue to a heart-healthy diet and increase your physical activities. Try to exercise for at least three times a week.  ? ? ?  ?It was a pleasure to see you and I look forward to continuing to work together on your health and well-being. ?Please do not hesitate to call the office if you need care or have questions about your care. ?  ?Have a wonderful day and week. ?With Gratitude, ?Gilmore Laroche MSN, FNP-BC  ?

## 2021-09-12 NOTE — Assessment & Plan Note (Signed)
Referral made to dermatology.

## 2021-09-12 NOTE — Assessment & Plan Note (Addendum)
Would like for a patient to speak with a therapist concerning her challenges ?The patient consented, and a referral made ?Started patient was on Zoloft ?

## 2021-10-03 DIAGNOSIS — E119 Type 2 diabetes mellitus without complications: Secondary | ICD-10-CM | POA: Diagnosis not present

## 2021-10-03 DIAGNOSIS — Z114 Encounter for screening for human immunodeficiency virus [HIV]: Secondary | ICD-10-CM | POA: Diagnosis not present

## 2021-10-03 DIAGNOSIS — E559 Vitamin D deficiency, unspecified: Secondary | ICD-10-CM | POA: Diagnosis not present

## 2021-10-03 DIAGNOSIS — R7301 Impaired fasting glucose: Secondary | ICD-10-CM | POA: Diagnosis not present

## 2021-10-03 DIAGNOSIS — Z1159 Encounter for screening for other viral diseases: Secondary | ICD-10-CM | POA: Diagnosis not present

## 2021-10-04 ENCOUNTER — Other Ambulatory Visit: Payer: Self-pay | Admitting: Family Medicine

## 2021-10-04 ENCOUNTER — Telehealth: Payer: Self-pay | Admitting: Family Medicine

## 2021-10-04 DIAGNOSIS — E781 Pure hyperglyceridemia: Secondary | ICD-10-CM

## 2021-10-04 DIAGNOSIS — E119 Type 2 diabetes mellitus without complications: Secondary | ICD-10-CM

## 2021-10-04 DIAGNOSIS — E559 Vitamin D deficiency, unspecified: Secondary | ICD-10-CM

## 2021-10-04 LAB — CMP14+EGFR
ALT: 18 IU/L (ref 0–32)
AST: 17 IU/L (ref 0–40)
Albumin/Globulin Ratio: 1.4 (ref 1.2–2.2)
Albumin: 4.2 g/dL (ref 3.8–4.8)
Alkaline Phosphatase: 109 IU/L (ref 44–121)
BUN/Creatinine Ratio: 40 — ABNORMAL HIGH (ref 9–23)
BUN: 8 mg/dL (ref 6–24)
Bilirubin Total: 0.3 mg/dL (ref 0.0–1.2)
CO2: 23 mmol/L (ref 20–29)
Calcium: 9.4 mg/dL (ref 8.7–10.2)
Chloride: 97 mmol/L (ref 96–106)
Creatinine, Ser: 0.2 mg/dL — ABNORMAL LOW (ref 0.57–1.00)
Globulin, Total: 3 g/dL (ref 1.5–4.5)
Glucose: 210 mg/dL — ABNORMAL HIGH (ref 70–99)
Potassium: 4.2 mmol/L (ref 3.5–5.2)
Sodium: 138 mmol/L (ref 134–144)
Total Protein: 7.2 g/dL (ref 6.0–8.5)
eGFR: 152 mL/min/{1.73_m2} (ref >59)

## 2021-10-04 LAB — LIPID PANEL
Chol/HDL Ratio: 9.4 ratio — ABNORMAL HIGH (ref 0.0–4.4)
Cholesterol, Total: 264 mg/dL — ABNORMAL HIGH (ref 100–199)
HDL: 28 mg/dL — ABNORMAL LOW (ref >39)
Triglycerides: 1165 mg/dL (ref 0–149)

## 2021-10-04 LAB — CBC WITH DIFFERENTIAL/PLATELET
Basophils Absolute: 0 10*3/uL (ref 0.0–0.2)
Basos: 1 %
EOS (ABSOLUTE): 0.1 10*3/uL (ref 0.0–0.4)
Eos: 1 %
Hematocrit: 43.6 % (ref 34.0–46.6)
Hemoglobin: 14.8 g/dL (ref 11.1–15.9)
Immature Grans (Abs): 0 10*3/uL (ref 0.0–0.1)
Immature Granulocytes: 0 %
Lymphocytes Absolute: 2.2 10*3/uL (ref 0.7–3.1)
Lymphs: 38 %
MCH: 29.9 pg (ref 26.6–33.0)
MCHC: 33.9 g/dL (ref 31.5–35.7)
MCV: 88 fL (ref 79–97)
Monocytes Absolute: 0.4 10*3/uL (ref 0.1–0.9)
Monocytes: 6 %
Neutrophils Absolute: 3.2 10*3/uL (ref 1.4–7.0)
Neutrophils: 54 %
Platelets: 263 10*3/uL (ref 150–450)
RBC: 4.95 x10E6/uL (ref 3.77–5.28)
RDW: 13.8 % (ref 11.7–15.4)
WBC: 5.9 10*3/uL (ref 3.4–10.8)

## 2021-10-04 LAB — HIV ANTIBODY (ROUTINE TESTING W REFLEX): HIV Screen 4th Generation wRfx: NONREACTIVE

## 2021-10-04 LAB — HEPATITIS C ANTIBODY: Hep C Virus Ab: NONREACTIVE

## 2021-10-04 LAB — VITAMIN D 25 HYDROXY (VIT D DEFICIENCY, FRACTURES): Vit D, 25-Hydroxy: 9.3 ng/mL — ABNORMAL LOW (ref 30.0–100.0)

## 2021-10-04 LAB — TSH+FREE T4
Free T4: 1.12 ng/dL (ref 0.82–1.77)
TSH: 2.06 u[IU]/mL (ref 0.450–4.500)

## 2021-10-04 LAB — HEMOGLOBIN A1C
Est. average glucose Bld gHb Est-mCnc: 246 mg/dL
Hgb A1c MFr Bld: 10.2 % — ABNORMAL HIGH (ref 4.8–5.6)

## 2021-10-04 MED ORDER — METFORMIN HCL 1000 MG PO TABS: 1000.0000 mg | ORAL_TABLET | Freq: Two times a day (BID) | ORAL | 3 refills | Status: DC

## 2021-10-04 MED ORDER — VITAMIN D (ERGOCALCIFEROL) 1.25 MG (50000 UNIT) PO CAPS: 50000.0000 [IU] | ORAL_CAPSULE | ORAL | 1 refills | Status: DC

## 2021-10-04 MED ORDER — ICOSAPENT ETHYL 1 G PO CAPS: 2.0000 g | ORAL_CAPSULE | Freq: Two times a day (BID) | ORAL | 0 refills | Status: DC

## 2021-10-04 MED ORDER — ROSUVASTATIN CALCIUM 20 MG PO TABS: 20.0000 mg | ORAL_TABLET | Freq: Every day | ORAL | 3 refills | Status: DC

## 2021-10-04 MED ORDER — OZEMPIC (0.25 OR 0.5 MG/DOSE) 2 MG/1.5ML ~~LOC~~ SOPN: PEN_INJECTOR | SUBCUTANEOUS | 0 refills | Status: AC

## 2021-10-04 NOTE — Progress Notes (Signed)
The 10-year ASCVD risk score (Arnett DK, et al., 2019) is: 23.3%   Values used to calculate the score:     Age: 41 years     Sex: Female     Is Non-Hispanic African American: No     Diabetic: Yes     Tobacco smoker: Yes     Systolic Blood Pressure: 118 mmHg     Is BP treated: No     HDL Cholesterol: 28 mg/dL     Total Cholesterol: 264 mg/dL

## 2021-10-04 NOTE — Progress Notes (Signed)
?   Statin.

## 2021-10-04 NOTE — Progress Notes (Signed)
Please inform the patient to pick up her medications at the pharmacy. I've started her on stating and vascepa for her cholesterol. Her triglyceride is elevated.  I've also increased her metformin to 1000mg  BID and added a once-weekly injection (ozempic) to help lower her Hga1c. Her HgA1c is 10.2.  Her vit D is low, and I've sent a prescription for once weekly Vit D supplement.  Lifestyle changes include weight loss and exercising at least 3 days a week for 150 min weekly. I also recommend a cholesterol-lowering diet low in fat or saturated fat and implementing the Mediterranean diet, which emphasizes fruits, vegetables, whole grains, beans, nuts, seeds, and healthy fats.

## 2021-10-07 NOTE — Telephone Encounter (Signed)
Attempted to contact pt unable to leave message.  

## 2021-10-07 NOTE — Telephone Encounter (Signed)
Pt is not pregnant or is not trying to become pregnant has been informed.

## 2021-10-31 ENCOUNTER — Ambulatory Visit: Payer: Self-pay | Admitting: Nurse Practitioner

## 2021-12-13 ENCOUNTER — Ambulatory Visit: Payer: BC Managed Care – PPO | Admitting: Family Medicine

## 2021-12-24 ENCOUNTER — Ambulatory Visit: Payer: BC Managed Care – PPO | Admitting: Family Medicine

## 2022-01-15 ENCOUNTER — Encounter: Payer: Self-pay | Admitting: Family Medicine

## 2022-01-15 ENCOUNTER — Other Ambulatory Visit (HOSPITAL_COMMUNITY): Payer: Self-pay | Admitting: Family Medicine

## 2022-01-15 ENCOUNTER — Ambulatory Visit: Payer: BC Managed Care – PPO | Admitting: Family Medicine

## 2022-01-15 VITALS — BP 132/78 | HR 98 | Ht <= 58 in | Wt 151.1 lb

## 2022-01-15 DIAGNOSIS — E559 Vitamin D deficiency, unspecified: Secondary | ICD-10-CM | POA: Diagnosis not present

## 2022-01-15 DIAGNOSIS — Z23 Encounter for immunization: Secondary | ICD-10-CM | POA: Diagnosis not present

## 2022-01-15 DIAGNOSIS — Z1231 Encounter for screening mammogram for malignant neoplasm of breast: Secondary | ICD-10-CM

## 2022-01-15 DIAGNOSIS — B070 Plantar wart: Secondary | ICD-10-CM

## 2022-01-15 DIAGNOSIS — F321 Major depressive disorder, single episode, moderate: Secondary | ICD-10-CM | POA: Diagnosis not present

## 2022-01-15 DIAGNOSIS — E119 Type 2 diabetes mellitus without complications: Secondary | ICD-10-CM

## 2022-01-15 DIAGNOSIS — R7301 Impaired fasting glucose: Secondary | ICD-10-CM

## 2022-01-15 NOTE — Assessment & Plan Note (Signed)
She denies SI/HI She is following up with a therapist on Friday, 01/17/22 Encouraged to follow up with her therapist to inform me if she needs to be started on antidepressants

## 2022-01-15 NOTE — Patient Instructions (Signed)
I appreciate the opportunity to provide care to you today!    Follow up:  1 months pap smear   Labs: please stop by the lab during the week to get your blood drawn (CBC, CMP, TSH, Lipid profile, HgA1c, Vit D)  Screening: Hep C      Please continue to a heart-healthy diet and increase your physical activities. Try to exercise for 30mins at least three times a week.      It was a pleasure to see you and I look forward to continuing to work together on your health and well-being. Please do not hesitate to call the office if you need care or have questions about your care.   Have a wonderful day and week. With Gratitude, Shatira Dobosz MSN, FNP-BC  

## 2022-01-15 NOTE — Progress Notes (Addendum)
Established Patient Office Visit  Subjective:  Patient ID: Tonya Knapp, female    DOB: 1981-03-13  Age: 41 y.o. MRN: [id number]  CC:  Chief Complaint  Patient presents with   Follow-up    3 month  f/u, pt states she has not been taking any medications has been having family stressors.     HPI Tonya Knapp is a 41 y.o. female with past medical history of plantar wart of the left foot, depression presents for f/u of  chronic medical conditions. T2DM: She reports not taking her medications after her dad's passing in May 2023. She is prescribed metformin 1000 mg BID.  She denies the 3ps of diabetes Depression: She reports that her symptoms have worsened with her dad's passing. She has a virtual appointment with a therapist on Friday 01/17/22.  She denies SI/HI. Plantar wart of the left foot: She notes not following up with dermatology due to her dad's passing. She voiced that she would reschedule her appointment. She denies pain and tenderness at the site.  History reviewed. No pertinent past medical history.  Past Surgical History:  Procedure Laterality Date   CESAREAN SECTION N/A 09/25/2017    History reviewed. No pertinent family history.  Social History   Socioeconomic History   Marital status: Married    Spouse name: Not on file   Number of children: Not on file   Years of education: Not on file   Highest education level: Not on file  Occupational History   Not on file  Tobacco Use   Smoking status: Every Day    Types: E-cigarettes   Smokeless tobacco: Current  Substance and Sexual Activity   Alcohol use: Yes    Comment: occasionally   Drug use: Not Currently   Sexual activity: Not Currently  Other Topics Concern   Not on file  Social History Narrative   Not on file   Social Determinants of Health   Financial Resource Strain: Not on file  Food Insecurity: Not on file  Transportation Needs: Not on file  Physical Activity: Not on file  Stress: Not on  file  Social Connections: Not on file  Intimate Partner Violence: Not on file    Outpatient Medications Prior to Visit  Medication Sig Dispense Refill   icosapent Ethyl (VASCEPA) 1 g capsule Take 2 capsules (2 g total) by mouth 2 (two) times daily. (Patient not taking: Reported on 01/15/2022) 120 capsule 0   metFORMIN (GLUCOPHAGE) 1000 MG tablet Take 1 tablet (1,000 mg total) by mouth 2 (two) times daily with a meal. (Patient not taking: Reported on 01/15/2022) 180 tablet 3   rosuvastatin (CRESTOR) 20 MG tablet Take 1 tablet (20 mg total) by mouth daily. (Patient not taking: Reported on 01/15/2022) 90 tablet 3   sertraline (ZOLOFT) 25 MG tablet Take 1 tablet (25 mg total) by mouth daily. (Patient not taking: Reported on 01/15/2022) 30 tablet 3   Vitamin D, Ergocalciferol, (DRISDOL) 1.25 MG (50000 UNIT) CAPS capsule Take 1 capsule (50,000 Units total) by mouth every 7 (seven) days. (Patient not taking: Reported on 01/15/2022) 5 capsule 1   No facility-administered medications prior to visit.    Allergies  Allergen Reactions   Hydrocodone     ROS Review of Systems  Constitutional:  Negative for fatigue and fever.  Cardiovascular:  Negative for chest pain and palpitations.  Endocrine: Negative for polydipsia, polyphagia and polyuria.  Psychiatric/Behavioral:  Negative for self-injury and suicidal ideas.  Objective:    Physical Exam HENT:     Head: Normocephalic.     Right Ear: External ear normal.     Left Ear: External ear normal.  Cardiovascular:     Rate and Rhythm: Normal rate and regular rhythm.     Pulses: Normal pulses.  Pulmonary:     Effort: Pulmonary effort is normal.     Breath sounds: Normal breath sounds.  Musculoskeletal:     Cervical back: No rigidity.  Neurological:     Mental Status: She is alert.     BP 132/78   Pulse 98   Ht 4' 6" (1.372 m)   Wt 151 lb 1.3 oz (68.5 kg)   LMP 12/21/2021   SpO2 97%   BMI 36.43 kg/m  Wt Readings from Last 3  Encounters:  01/15/22 151 lb 1.3 oz (68.5 kg)  09/12/21 141 lb 6.4 oz (64.1 kg)    Lab Results  Component Value Date   TSH 2.060 10/03/2021   Lab Results  Component Value Date   WBC 5.9 10/03/2021   HGB 14.8 10/03/2021   HCT 43.6 10/03/2021   MCV 88 10/03/2021   PLT 263 10/03/2021   Lab Results  Component Value Date   NA 138 10/03/2021   K 4.2 10/03/2021   CO2 23 10/03/2021   GLUCOSE 210 (H) 10/03/2021   BUN 8 10/03/2021   CREATININE 0.20 (L) 10/03/2021   BILITOT 0.3 10/03/2021   ALKPHOS 109 10/03/2021   AST 17 10/03/2021   ALT 18 10/03/2021   PROT 7.2 10/03/2021   ALBUMIN 4.2 10/03/2021   CALCIUM 9.4 10/03/2021   EGFR 152 10/03/2021   Lab Results  Component Value Date   CHOL 264 (H) 10/03/2021   Lab Results  Component Value Date   HDL 28 (L) 10/03/2021   Lab Results  Component Value Date   LDLCALC Comment (A) 10/03/2021   Lab Results  Component Value Date   TRIG 1,165 ([place]) 10/03/2021   Lab Results  Component Value Date   CHOLHDL 9.4 (H) 10/03/2021   Lab Results  Component Value Date   HGBA1C 10.2 (H) 10/03/2021      Assessment & Plan:   Problem List Items Addressed This Visit       Endocrine   Type 2 diabetes mellitus without complication, without long-term current use of insulin ([place]) - Primary    She reports not taking her metformin 1038m BID due to her dad passing  Will assess labs and add a GLP-1 with her metformin She denies the 3ps of diabetes Lab Results  Component Value Date   HGBA1C 10.2 (H) 10/03/2021        Relevant Orders   Hemoglobin A1C   Urine Microalbumin w/creat. ratio     Musculoskeletal and Integument   Plantar wart of left foot    She notes not following up with dermatology due to her dad's passing She voiced that she would reschedule her appointment She denies pain and tenderness at the site Encouraged to follow up with dermatology as needed          Other   Depression, major, single episode, moderate  ([place]    She denies SI/HI She is following up with a therapist on Friday, 01/17/22 Encouraged to follow up with her therapist to inform me if she needs to be started on antidepressants      Need for immunization against influenza    Patient educated on CDC recommendation for the vaccine. Verbal consent  was obtained from the patient, vaccine administered by nurse, no sign of adverse reactions noted at this time. Patient education on arm soreness and use of tylenol or ibuprofen for this patient  was discussed. Patient educated on the signs and symptoms of adverse effect and advise to contact the office if they occur.      Other Visit Diagnoses     Immunization due       Relevant Orders   Flu Vaccine QUAD 6+ mos PF IM (Fluarix Quad PF) (Completed)   IFG (impaired fasting glucose)       Relevant Orders   CBC with Differential/Platelet   CMP14+EGFR   TSH + free T4   Hemoglobin A1C   Lipid Profile   Vitamin D deficiency       Relevant Orders   VITAMIN D 25 Hydroxy (Vit-D Deficiency, Fractures)       No orders of the defined types were placed in this encounter.   Follow-up: Return in about 1 year (around 01/16/2023) for pap smear.    Alvira Monday, FNP

## 2022-01-15 NOTE — Assessment & Plan Note (Signed)
She notes not following up with dermatology due to her dad's passing She voiced that she would reschedule her appointment She denies pain and tenderness at the site Encouraged to follow up with dermatology as needed

## 2022-01-15 NOTE — Assessment & Plan Note (Signed)
She reports not taking her metformin 1000mg  BID due to her dad passing  Will assess labs and add a GLP-1 with her metformin She denies the 3ps of diabetes Lab Results  Component Value Date   HGBA1C 10.2 (H) 10/03/2021

## 2022-01-15 NOTE — Assessment & Plan Note (Signed)
Patient educated on CDC recommendation for the vaccine. Verbal consent was obtained from the patient, vaccine administered by nurse, no sign of adverse reactions noted at this time. Patient education on arm soreness and use of tylenol or ibuprofen for this patient  was discussed. Patient educated on the signs and symptoms of adverse effect and advise to contact the office if they occur.  

## 2022-01-22 ENCOUNTER — Ambulatory Visit (HOSPITAL_COMMUNITY): Payer: BC Managed Care – PPO

## 2022-02-18 ENCOUNTER — Encounter: Payer: BC Managed Care – PPO | Admitting: Family Medicine

## 2022-05-23 ENCOUNTER — Encounter: Payer: Self-pay | Admitting: Family Medicine

## 2022-05-23 ENCOUNTER — Ambulatory Visit (INDEPENDENT_AMBULATORY_CARE_PROVIDER_SITE_OTHER): Payer: BC Managed Care – PPO | Admitting: Family Medicine

## 2022-05-23 VITALS — BP 129/76 | HR 82 | Ht <= 58 in | Wt 152.0 lb

## 2022-05-23 DIAGNOSIS — H539 Unspecified visual disturbance: Secondary | ICD-10-CM | POA: Diagnosis not present

## 2022-05-23 DIAGNOSIS — R110 Nausea: Secondary | ICD-10-CM | POA: Diagnosis not present

## 2022-05-23 DIAGNOSIS — R7301 Impaired fasting glucose: Secondary | ICD-10-CM | POA: Diagnosis not present

## 2022-05-23 DIAGNOSIS — E119 Type 2 diabetes mellitus without complications: Secondary | ICD-10-CM | POA: Diagnosis not present

## 2022-05-23 DIAGNOSIS — E559 Vitamin D deficiency, unspecified: Secondary | ICD-10-CM | POA: Diagnosis not present

## 2022-05-23 MED ORDER — ONDANSETRON HCL 4 MG PO TABS: 4.0000 mg | ORAL_TABLET | Freq: Three times a day (TID) | ORAL | 0 refills | Status: AC | PRN

## 2022-05-23 NOTE — Patient Instructions (Addendum)
I appreciate the opportunity to provide care to you today!    Follow up:  06/30/22  Please follow-up with your ophthalmologist, and schedule be seen sooner You have 20/20 vision in both eyes I recommend taking Tylenol as needed for headaches Please stay well-hydrated to prevent dizziness, I recommend drinking at least 64 ounces of water daily A prescription for Zofran has been ordered for nausea      Please continue to a heart-healthy diet and increase your physical activities. Try to exercise for 34mins at least five times a week.      It was a pleasure to see you and I look forward to continuing to work together on your health and well-being. Please do not hesitate to call the office if you need care or have questions about your care.   Have a wonderful day and week. With Gratitude, Alvira Monday MSN, FNP-BC

## 2022-05-23 NOTE — Progress Notes (Signed)
Acute Office Visit  Subjective:    Patient ID: Tonya Knapp, female    DOB: Jun 25, 1980, 41 y.o.   MRN: [id number]  Chief Complaint  Patient presents with   Dizziness    Patient complains of L eye tunnel vision, dizziness and nausea, no specific time of day or motions associated with it. Started a couple weeks ago.     HPI Patient is in today with complaints of visual disturbances. For the details of today's visit, please refer to the assessment and plan.     No past medical history on file.  Past Surgical History:  Procedure Laterality Date   CESAREAN SECTION N/A 09/25/2017    No family history on file.  Social History   Socioeconomic History   Marital status: Married    Spouse name: Not on file   Number of children: Not on file   Years of education: Not on file   Highest education level: Not on file  Occupational History   Not on file  Tobacco Use   Smoking status: Every Day    Types: E-cigarettes   Smokeless tobacco: Current  Substance and Sexual Activity   Alcohol use: Yes    Comment: occasionally   Drug use: Not Currently   Sexual activity: Not Currently  Other Topics Concern   Not on file  Social History Narrative   Not on file   Social Determinants of Health   Financial Resource Strain: Not on file  Food Insecurity: Not on file  Transportation Needs: Not on file  Physical Activity: Not on file  Stress: Not on file  Social Connections: Not on file  Intimate Partner Violence: Not on file    Outpatient Medications Prior to Visit  Medication Sig Dispense Refill   icosapent Ethyl (VASCEPA) 1 g capsule Take 2 capsules (2 g total) by mouth 2 (two) times daily. (Patient not taking: Reported on 01/15/2022) 120 capsule 0   metFORMIN (GLUCOPHAGE) 1000 MG tablet Take 1 tablet (1,000 mg total) by mouth 2 (two) times daily with a meal. (Patient not taking: Reported on 01/15/2022) 180 tablet 3   rosuvastatin (CRESTOR) 20 MG tablet Take 1 tablet (20 mg total)  by mouth daily. (Patient not taking: Reported on 01/15/2022) 90 tablet 3   sertraline (ZOLOFT) 25 MG tablet Take 1 tablet (25 mg total) by mouth daily. (Patient not taking: Reported on 01/15/2022) 30 tablet 3   Vitamin D, Ergocalciferol, (DRISDOL) 1.25 MG (50000 UNIT) CAPS capsule Take 1 capsule (50,000 Units total) by mouth every 7 (seven) days. (Patient not taking: Reported on 01/15/2022) 5 capsule 1   No facility-administered medications prior to visit.    Allergies  Allergen Reactions   Hydrocodone     Review of Systems  Constitutional:  Negative for chills and fever.  Eyes:  Positive for visual disturbance. Negative for photophobia, pain, discharge, redness and itching.  Respiratory:  Negative for chest tightness and shortness of breath.   Neurological:  Negative for dizziness and headaches.       Objective:    Physical Exam Eyes:     Extraocular Movements:     Right eye: Normal extraocular motion and no nystagmus.     Left eye: Normal extraocular motion and no nystagmus.     Comments:  Normal visual fields by confrontation intact bilaterally      BP 129/76   Pulse 82   Ht 4\' 7"  (1.397 m)   Wt 152 lb (68.9 kg)   SpO2 97%  BMI 35.33 kg/m  Wt Readings from Last 3 Encounters:  05/23/22 152 lb (68.9 kg)  01/15/22 151 lb 1.3 oz (68.5 kg)  09/12/21 141 lb 6.4 oz (64.1 kg)       Assessment & Plan:  Visual disturbances Assessment & Plan: Onset of symptoms 2 weeks ago She complains of a sharp light at the corner of her eye that comes and goes with no changes in her visual acuity She denies eye pain and pressure, halos around lights, blurry vision, and decreased nighttime vision Little suspicion of glaucoma She does report headaches with nausea when having sharp lights at the corner of her eyes Of note, the patient wears glasses for farsightedness; however, she does have her contact lenses in She has 20/20 vision in both eyes and 20/25 vision in the right and left  eye She is following up with her ophthalmologist in February 2024 Encouraged the patient to call for an earlier appointment Encouraged to take Tylenol as needed for headaches Zofran ordered for nausea     Nausea -     Ondansetron HCl; Take 1 tablet (4 mg total) by mouth every 8 (eight) hours as needed for nausea or vomiting.  Dispense: 20 tablet; Refill: 0    Tonya Monday, FNP

## 2022-05-23 NOTE — Assessment & Plan Note (Addendum)
Onset of symptoms 2 weeks ago She complains of a sharp light at the corner of her eye that comes and goes with no changes in her visual acuity She denies eye pain and pressure, halos around lights, blurry vision, and decreased nighttime vision Little suspicion of glaucoma She does report headaches with nausea when having sharp lights at the corner of her eyes Of note, the patient wears glasses for farsightedness; however, she does have her contact lenses in She has 20/20 vision in both eyes and 20/25 vision in the right and left eye She is following up with her ophthalmologist in February 2024 Encouraged the patient to call for an earlier appointment Encouraged to take Tylenol as needed for headaches Zofran ordered for nausea

## 2022-05-25 LAB — CMP14+EGFR
ALT: 24 IU/L (ref 0–32)
AST: 13 IU/L (ref 0–40)
Albumin/Globulin Ratio: 1.5 (ref 1.2–2.2)
Albumin: 4.4 g/dL (ref 3.9–4.9)
Alkaline Phosphatase: 96 IU/L (ref 44–121)
BUN/Creatinine Ratio: 25 — ABNORMAL HIGH (ref 9–23)
BUN: 11 mg/dL (ref 6–24)
Bilirubin Total: 0.4 mg/dL (ref 0.0–1.2)
CO2: 19 mmol/L — ABNORMAL LOW (ref 20–29)
Calcium: 9.1 mg/dL (ref 8.7–10.2)
Chloride: 99 mmol/L (ref 96–106)
Creatinine, Ser: 0.44 mg/dL — ABNORMAL LOW (ref 0.57–1.00)
Globulin, Total: 3 g/dL (ref 1.5–4.5)
Glucose: 213 mg/dL — ABNORMAL HIGH (ref 70–99)
Potassium: 4.2 mmol/L (ref 3.5–5.2)
Sodium: 136 mmol/L (ref 134–144)
Total Protein: 7.4 g/dL (ref 6.0–8.5)
eGFR: 125 mL/min/{1.73_m2} (ref >59)

## 2022-05-25 LAB — LIPID PANEL
Chol/HDL Ratio: 6.5 ratio — ABNORMAL HIGH (ref 0.0–4.4)
Cholesterol, Total: 215 mg/dL — ABNORMAL HIGH (ref 100–199)
HDL: 33 mg/dL — ABNORMAL LOW (ref >39)
LDL Chol Calc (NIH): 93 mg/dL (ref 0–99)
Triglycerides: 539 mg/dL — ABNORMAL HIGH (ref 0–149)
VLDL Cholesterol Cal: 89 mg/dL — ABNORMAL HIGH (ref 5–40)

## 2022-05-25 LAB — VITAMIN D 25 HYDROXY (VIT D DEFICIENCY, FRACTURES): Vit D, 25-Hydroxy: 10.4 ng/mL — ABNORMAL LOW (ref 30.0–100.0)

## 2022-05-25 LAB — CBC WITH DIFFERENTIAL/PLATELET
Basophils Absolute: 0 10*3/uL (ref 0.0–0.2)
Basos: 1 %
EOS (ABSOLUTE): 0.1 10*3/uL (ref 0.0–0.4)
Eos: 1 %
Hematocrit: 43.8 % (ref 34.0–46.6)
Hemoglobin: 15.1 g/dL (ref 11.1–15.9)
Immature Grans (Abs): 0 10*3/uL (ref 0.0–0.1)
Immature Granulocytes: 0 %
Lymphocytes Absolute: 2.3 10*3/uL (ref 0.7–3.1)
Lymphs: 31 %
MCH: 31.1 pg (ref 26.6–33.0)
MCHC: 34.5 g/dL (ref 31.5–35.7)
MCV: 90 fL (ref 79–97)
Monocytes Absolute: 0.6 10*3/uL (ref 0.1–0.9)
Monocytes: 8 %
Neutrophils Absolute: 4.5 10*3/uL (ref 1.4–7.0)
Neutrophils: 59 %
Platelets: 245 10*3/uL (ref 150–450)
RBC: 4.85 x10E6/uL (ref 3.77–5.28)
RDW: 12.3 % (ref 11.7–15.4)
WBC: 7.6 10*3/uL (ref 3.4–10.8)

## 2022-05-25 LAB — HEMOGLOBIN A1C
Est. average glucose Bld gHb Est-mCnc: 229 mg/dL
Hgb A1c MFr Bld: 9.6 % — ABNORMAL HIGH (ref 4.8–5.6)

## 2022-05-25 LAB — MICROALBUMIN / CREATININE URINE RATIO
Creatinine, Urine: 167.9 mg/dL
Microalb/Creat Ratio: 42 mg/g creat — ABNORMAL HIGH (ref 0–29)
Microalbumin, Urine: 70.8 ug/mL

## 2022-05-25 LAB — TSH+FREE T4
Free T4: 1.21 ng/dL (ref 0.82–1.77)
TSH: 1.15 u[IU]/mL (ref 0.450–4.500)

## 2022-06-07 ENCOUNTER — Other Ambulatory Visit: Payer: Self-pay | Admitting: Family Medicine

## 2022-06-07 DIAGNOSIS — E119 Type 2 diabetes mellitus without complications: Secondary | ICD-10-CM

## 2022-06-07 DIAGNOSIS — E559 Vitamin D deficiency, unspecified: Secondary | ICD-10-CM

## 2022-06-07 DIAGNOSIS — E781 Pure hyperglyceridemia: Secondary | ICD-10-CM

## 2022-06-07 DIAGNOSIS — E785 Hyperlipidemia, unspecified: Secondary | ICD-10-CM

## 2022-06-07 MED ORDER — ICOSAPENT ETHYL 1 G PO CAPS: 2.0000 g | ORAL_CAPSULE | Freq: Two times a day (BID) | ORAL | 0 refills | Status: AC

## 2022-06-07 MED ORDER — TRULICITY 0.75 MG/0.5ML ~~LOC~~ SOAJ: 0.7500 mg | SUBCUTANEOUS | 0 refills | Status: AC

## 2022-06-07 MED ORDER — ROSUVASTATIN CALCIUM 20 MG PO TABS: 20.0000 mg | ORAL_TABLET | Freq: Every day | ORAL | 3 refills | Status: AC

## 2022-06-07 MED ORDER — METFORMIN HCL 1000 MG PO TABS: 1000.0000 mg | ORAL_TABLET | Freq: Two times a day (BID) | ORAL | 3 refills | Status: AC

## 2022-06-07 MED ORDER — VITAMIN D (ERGOCALCIFEROL) 1.25 MG (50000 UNIT) PO CAPS: 50000.0000 [IU] | ORAL_CAPSULE | ORAL | 1 refills | Status: AC

## 2022-06-07 NOTE — Progress Notes (Signed)
Please inform the patient that her hemoglobin A1c has decreased from 10.2 to 9.6.  Please continue taking metformin 1000 mg twice daily.  I have started you on Trulicity 3.54 mg weekly.  Please call for refill of this medication once completed.  Your vitamin D is low, I sent a refill of your weekly vitamin D supplement to your pharmacy to start taking.  Your triglyceride has improved and your LDL cholesterol is at goal.  Please continue taking rosuvastatin 20 mg daily, with your vascepa 1 g daily.   I recommend avoiding simple carbohydrates including cakes, sweet desserts, ice cream, soda (diet or regular), sweet tea, candies, chips, cookies, store-bought juices, alcohol in excess of 1-2 drinks a day, lemonade, artificial sweeteners, donuts, coffee creamers, and sugar-free products.  I recommend avoiding greasy, fatty foods with increased physical activity.

## 2022-06-09 ENCOUNTER — Other Ambulatory Visit: Payer: Self-pay

## 2022-06-30 ENCOUNTER — Ambulatory Visit (INDEPENDENT_AMBULATORY_CARE_PROVIDER_SITE_OTHER): Payer: BC Managed Care – PPO | Admitting: Family Medicine

## 2022-06-30 ENCOUNTER — Encounter: Payer: Self-pay | Admitting: Family Medicine

## 2022-06-30 VITALS — BP 134/89 | HR 83 | Ht <= 58 in | Wt 154.0 lb

## 2022-06-30 DIAGNOSIS — Z0001 Encounter for general adult medical examination with abnormal findings: Secondary | ICD-10-CM | POA: Diagnosis not present

## 2022-06-30 DIAGNOSIS — Z23 Encounter for immunization: Secondary | ICD-10-CM

## 2022-06-30 DIAGNOSIS — L700 Acne vulgaris: Secondary | ICD-10-CM | POA: Insufficient documentation

## 2022-06-30 DIAGNOSIS — E119 Type 2 diabetes mellitus without complications: Secondary | ICD-10-CM

## 2022-06-30 MED ORDER — GLIMEPIRIDE 2 MG PO TABS: 2.0000 mg | ORAL_TABLET | Freq: Every day | ORAL | 3 refills | Status: AC

## 2022-06-30 MED ORDER — TRETINOIN 0.025 % EX CREA: TOPICAL_CREAM | CUTANEOUS | 0 refills | Status: AC

## 2022-06-30 NOTE — Assessment & Plan Note (Signed)
Generalized multiple dome-shaped papules noted on the patient's arms, abdomen, groins, and back Onset of symptoms 5 years ago She denies pruritus, fever, and pain with the onset of the papules She reports that symptoms are typically self-limiting Will be treated today with Retin-A

## 2022-06-30 NOTE — Progress Notes (Signed)
Complete physical exam  Patient: Tonya Knapp   DOB: Jun 11, 1980   42 y.o. Female  MRN: [id number]  Subjective:    Chief Complaint  Patient presents with   Annual Exam    Tonya Knapp is a 42 y.o. female who presents today for a complete physical exam. She reports consuming a general diet. The patient does not participate in regular exercise at present. She generally feels well. She reports sleeping well. She does have additional problems to discuss today.    Most recent fall risk assessment:    06/30/2022    1:28 PM  [place] in the past year? 0  Number falls in past yr: 0  Injury with Fall? 0  Risk for fall due to : No Fall Risks  Follow up Falls evaluation completed     Most recent depression screenings:    06/30/2022    1:28 PM 05/23/2022    1:22 PM  PHQ 2/9 Scores  PHQ - 2 Score 1 1  PHQ- 9 Score 2 6    Dental: No current dental problems and No regular dental care   Patient Active Problem List   Diagnosis Date Noted   Acne vulgaris 06/30/2022   Encounter for annual general medical examination with abnormal findings in adult 06/30/2022   Visual disturbances 05/23/2022   Type 2 diabetes mellitus without complication, without long-term current use of insulin ([place]) 01/15/2022   Need for immunization against influenza 01/15/2022   Plantar wart of left foot 09/12/2021   Lentigo 09/12/2021   Depression, major, single episode, moderate ([place]) 09/12/2021   NAUSEA WITH VOMITING 05/09/2009   DYSPHAGIA UNSPECIFIED 05/09/2009   RUQ PAIN 05/09/2009   EPIGASTRIC PAIN 05/09/2009   GERD 03/21/2009   DIARRHEA, CHRONIC 03/21/2009   No past medical history on file. Past Surgical History:  Procedure Laterality Date   CESAREAN SECTION N/A 09/25/2017   Social History   Tobacco Use   Smoking status: Every Day    Types: E-cigarettes   Smokeless tobacco: Current  Substance Use Topics   Alcohol use: Yes    Comment: occasionally   Drug use: Not Currently    Social History   Socioeconomic History   Marital status: Married    Spouse name: Not on file   Number of children: Not on file   Years of education: Not on file   Highest education level: Not on file  Occupational History   Not on file  Tobacco Use   Smoking status: Every Day    Types: E-cigarettes   Smokeless tobacco: Current  Substance and Sexual Activity   Alcohol use: Yes    Comment: occasionally   Drug use: Not Currently   Sexual activity: Not Currently  Other Topics Concern   Not on file  Social History Narrative   Not on file   Social Determinants of Health   Financial Resource Strain: Not on file  Food Insecurity: Not on file  Transportation Needs: Not on file  Physical Activity: Not on file  Stress: Not on file  Social Connections: Not on file  Intimate Partner Violence: Not on file   No family status information on file.   No family history on file. Allergies  Allergen Reactions   Hydrocodone       Patient Care Team: Alvira Monday, FNP as PCP - General (Family Medicine)   Outpatient Medications Prior to Visit  Medication Sig   Dulaglutide (TRULICITY) A999333 [id number] SOPN Inject 0.75 mg into  the skin once a week.   icosapent Ethyl (VASCEPA) 1 g capsule Take 2 capsules (2 g total) by mouth 2 (two) times daily.   metFORMIN (GLUCOPHAGE) 1000 MG tablet Take 1 tablet (1,000 mg total) by mouth 2 (two) times daily with a meal.   ondansetron (ZOFRAN) 4 MG tablet Take 1 tablet (4 mg total) by mouth every 8 (eight) hours as needed for nausea or vomiting.   rosuvastatin (CRESTOR) 20 MG tablet Take 1 tablet (20 mg total) by mouth daily.   sertraline (ZOLOFT) 25 MG tablet Take 1 tablet (25 mg total) by mouth daily.   Vitamin D, Ergocalciferol, (DRISDOL) 1.25 MG (50000 UNIT) CAPS capsule Take 1 capsule (50,000 Units total) by mouth every 7 (seven) days.   No facility-administered medications prior to visit.    Review of Systems  Constitutional:  Negative for  chills, fever and malaise/fatigue.  HENT:  Negative for congestion and sinus pain.   Eyes:  Negative for pain, discharge and redness.  Respiratory:  Negative for cough, sputum production and shortness of breath.   Cardiovascular:  Negative for chest pain, palpitations, claudication and leg swelling.  Gastrointestinal:  Negative for diarrhea, heartburn and nausea.  Genitourinary:  Negative for flank pain and frequency.  Musculoskeletal:  Negative for back pain and joint pain.  Skin:  Positive for rash. Negative for itching.  Neurological:  Negative for dizziness, seizures and headaches.  Endo/Heme/Allergies:  Negative for environmental allergies.  Psychiatric/Behavioral:  Negative for memory loss. The patient does not have insomnia.        Objective:    BP 134/89   Pulse 83   Ht '4\' 7"'$  (1.397 m)   Wt 154 lb (69.9 kg)   SpO2 97%   BMI 35.79 kg/m  BP Readings from Last 3 Encounters:  06/30/22 134/89  05/23/22 129/76  01/15/22 132/78   Wt Readings from Last 3 Encounters:  06/30/22 154 lb (69.9 kg)  05/23/22 152 lb (68.9 kg)  01/15/22 151 lb 1.3 oz (68.5 kg)      Physical Exam HENT:     Head: Normocephalic.     Left Ear: External ear normal.     Mouth/Throat:     Mouth: Mucous membranes are moist.  Eyes:     Extraocular Movements: Extraocular movements intact.     Pupils: Pupils are equal, round, and reactive to light.  Cardiovascular:     Rate and Rhythm: Normal rate and regular rhythm.     Heart sounds: No murmur heard. Pulmonary:     Effort: Pulmonary effort is normal.     Breath sounds: Normal breath sounds.  Abdominal:     Palpations: Abdomen is soft.     Tenderness: There is no right CVA tenderness or left CVA tenderness.  Musculoskeletal:     Right lower leg: No edema.     Left lower leg: No edema.  Skin:    Findings: Lesion (Generalized multiple 3 to 5 mm pink firm of papules noted on the body) present. No rash.  Neurological:     Mental Status: She is  alert and oriented to person, place, and time.     GCS: GCS eye subscore is 4. GCS verbal subscore is 5. GCS motor subscore is 6.     Cranial Nerves: No facial asymmetry.     Sensory: No sensory deficit.     Motor: No weakness.     Coordination: Coordination normal. Finger-Nose-Finger Test normal.     Gait: Gait normal.  Psychiatric:  Judgment: Judgment normal.     No results found for any visits on 06/30/22. Last CBC Lab Results  Component Value Date   WBC 7.6 05/23/2022   HGB 15.1 05/23/2022   HCT 43.8 05/23/2022   MCV 90 05/23/2022   MCH 31.1 05/23/2022   RDW 12.3 05/23/2022   PLT 245 [id number]   Last metabolic panel Lab Results  Component Value Date   GLUCOSE 213 (H) 05/23/2022   NA 136 05/23/2022   K 4.2 05/23/2022   CL 99 05/23/2022   CO2 19 (L) 05/23/2022   BUN 11 05/23/2022   CREATININE 0.44 (L) 05/23/2022   EGFR 125 05/23/2022   CALCIUM 9.1 05/23/2022   PROT 7.4 05/23/2022   ALBUMIN 4.4 05/23/2022   LABGLOB 3.0 05/23/2022   AGRATIO 1.5 05/23/2022   BILITOT 0.4 05/23/2022   ALKPHOS 96 05/23/2022   AST 13 05/23/2022   ALT 24 05/23/2022   Last lipids Lab Results  Component Value Date   CHOL 215 (H) 05/23/2022   HDL 33 (L) 05/23/2022   LDLCALC 93 05/23/2022   TRIG 539 (H) 05/23/2022   CHOLHDL 6.5 (H) 05/23/2022   Last hemoglobin A1c Lab Results  Component Value Date   HGBA1C 9.6 (H) 05/23/2022   Last thyroid functions Lab Results  Component Value Date   TSH 1.150 05/23/2022   Last vitamin D Lab Results  Component Value Date   VD25OH 10.4 (L) 05/23/2022   Last vitamin B12 and Folate No results found for: "VITAMINB12", "FOLATE"      Assessment & Plan:    Routine Health Maintenance and Physical Exam  Immunization History  Administered Date(s) Administered   Influenza,inj,Quad PF,6+ Mos 01/15/2022   Tdap 06/30/2022    Health Maintenance  Topic Date Due   COVID-19 Vaccine (1) Never done   OPHTHALMOLOGY EXAM  Never done    PAP SMEAR-Modifier  Never done   HEMOGLOBIN A1C  11/21/2022   Diabetic kidney evaluation - eGFR measurement  05/24/2023   Diabetic kidney evaluation - Urine ACR  05/24/2023   FOOT EXAM  07/01/2023   DTaP/Tdap/Td (2 - Td or Tdap) 06/30/2032   INFLUENZA VACCINE  Completed   Hepatitis C Screening  Completed   HIV Screening  Completed   HPV VACCINES  Aged Out    Discussed health benefits of physical activity, and encouraged her to engage in regular exercise appropriate for her age and condition.  Encounter for annual general medical examination with abnormal findings in adult Assessment & Plan: Physical exam as documented Counseling is done on healthy lifestyle involving commitment to 150 minutes of exercise per week,  Discussed heart-healthy diet and attaining a healthy weight The patient received her tetanus vaccination today She will follow-up for her Pap smear examination        Type 2 diabetes mellitus without complication, without long-term current use of insulin ([facility]) -     Glimepiride; Take 1 tablet (2 mg total) by mouth daily before breakfast.  Dispense: 30 tablet; Refill: 3  Acne vulgaris Assessment & Plan: Generalized multiple dome-shaped papules noted on the patient's arms, abdomen, groins, and back Onset of symptoms 5 years ago She denies pruritus, fever, and pain with the onset of the papules She reports that symptoms are typically self-limiting Will be treated today with Retin-A  Orders: -     Tretinoin; Apply to acne lesions 20-30 min after cleansing the skin once daily before bedtime or in the evening  Dispense: 45 g; Refill: 0  Need for  Tdap vaccination -     Tdap vaccine greater than or equal to 7yo IM    Return in about 1 year (around 07/01/2023) for CPE.     Alvira Monday, FNP

## 2022-06-30 NOTE — Patient Instructions (Addendum)
I appreciate the opportunity to provide care to you today!    Follow up:  1 weeks   Labs: next visit   Acne Vulgaris Retin-A (apply in the evening) Retin-A helps decrease facial wrinkles and hyper or hypopigmentation Apply to acne lesions once daily before bedtime or in the evening; apply 20-30 minutes after cleansing the face;Patients should apply a thin layer of the topical retinoid to the entire affected area rather than as a spot treatment to individual lesions. A pea-sized amount of medication is usually sufficient to cover the entire face. Skin should be dry at the time of application      I recommend taking fish oil 2000 mg BID  Please continue to a heart-healthy diet and increase your physical activities. Try to exercise for 4mns at least five times a week.      It was a pleasure to see you and I look forward to continuing to work together on your health and well-being. Please do not hesitate to call the office if you need care or have questions about your care.   Have a wonderful day and week. With Gratitude, GAlvira MondayMSN, FNP-BC

## 2022-06-30 NOTE — Assessment & Plan Note (Signed)
Physical exam as documented Counseling is done on healthy lifestyle involving commitment to 150 minutes of exercise per week,  Discussed heart-healthy diet and attaining a healthy weight The patient received her tetanus vaccination today She will follow-up for her Pap smear examination

## 2022-07-04 ENCOUNTER — Encounter (INDEPENDENT_AMBULATORY_CARE_PROVIDER_SITE_OTHER): Payer: BC Managed Care – PPO | Admitting: Ophthalmology

## 2022-07-07 ENCOUNTER — Ambulatory Visit: Payer: BC Managed Care – PPO | Admitting: Family Medicine

## 2022-07-07 ENCOUNTER — Telehealth: Payer: Self-pay | Admitting: Family Medicine

## 2022-07-07 NOTE — Telephone Encounter (Signed)
Kindly proceed with the clinic policy, given the patient has been fully aware of the clinic's policy and subsequent actions.

## 2022-07-07 NOTE — Telephone Encounter (Signed)
Pt has missed 3 appts (12/24/21, 02/18/22 & 07/07/22). How would you like to proceed?

## 2022-07-11 ENCOUNTER — Encounter: Payer: Self-pay | Admitting: Family Medicine

## 2022-07-14 ENCOUNTER — Ambulatory Visit: Payer: BC Managed Care – PPO | Admitting: Family Medicine

## 2022-08-26 ENCOUNTER — Encounter (INDEPENDENT_AMBULATORY_CARE_PROVIDER_SITE_OTHER): Payer: BC Managed Care – PPO | Admitting: Ophthalmology

## 2022-11-24 DIAGNOSIS — N760 Acute vaginitis: Secondary | ICD-10-CM | POA: Diagnosis not present

## 2022-11-24 DIAGNOSIS — N3090 Cystitis, unspecified without hematuria: Secondary | ICD-10-CM | POA: Diagnosis not present

## 2022-11-24 DIAGNOSIS — R350 Frequency of micturition: Secondary | ICD-10-CM | POA: Diagnosis not present

## 2022-11-24 DIAGNOSIS — N23 Unspecified renal colic: Secondary | ICD-10-CM | POA: Diagnosis not present

## 2022-11-24 DIAGNOSIS — R300 Dysuria: Secondary | ICD-10-CM | POA: Diagnosis not present

## 2023-06-12 ENCOUNTER — Other Ambulatory Visit: Payer: Self-pay

## 2023-06-12 ENCOUNTER — Emergency Department (HOSPITAL_COMMUNITY)
Admission: EM | Admit: 2023-06-12 | Discharge: 2023-06-12 | Disposition: A | Discharge status: Home / Self Care | Payer: BC Managed Care – PPO | Attending: Emergency Medicine | Admitting: Emergency Medicine

## 2023-06-12 ENCOUNTER — Encounter (HOSPITAL_COMMUNITY): Payer: Self-pay | Admitting: *Deleted

## 2023-06-12 DIAGNOSIS — Z20822 Contact with and (suspected) exposure to covid-19: Secondary | ICD-10-CM | POA: Insufficient documentation

## 2023-06-12 DIAGNOSIS — Z7984 Long term (current) use of oral hypoglycemic drugs: Secondary | ICD-10-CM | POA: Insufficient documentation

## 2023-06-12 DIAGNOSIS — R509 Fever, unspecified: Secondary | ICD-10-CM | POA: Diagnosis not present

## 2023-06-12 DIAGNOSIS — J069 Acute upper respiratory infection, unspecified: Secondary | ICD-10-CM | POA: Diagnosis not present

## 2023-06-12 DIAGNOSIS — B9789 Other viral agents as the cause of diseases classified elsewhere: Secondary | ICD-10-CM | POA: Diagnosis not present

## 2023-06-12 DIAGNOSIS — E119 Type 2 diabetes mellitus without complications: Secondary | ICD-10-CM | POA: Insufficient documentation

## 2023-06-12 LAB — RESP PANEL BY RT-PCR (RSV, FLU A&B, COVID)  RVPGX2
Influenza A by PCR: NEGATIVE
Influenza B by PCR: NEGATIVE
Resp Syncytial Virus by PCR: NEGATIVE
SARS Coronavirus 2 by RT PCR: NEGATIVE

## 2023-06-12 MED ORDER — ONDANSETRON 4 MG PO TBDP: 4.0000 mg | ORAL_TABLET | Freq: Three times a day (TID) | ORAL | 0 refills | Status: AC | PRN

## 2023-06-12 MED ORDER — ONDANSETRON 4 MG PO TBDP
4.0000 mg | ORAL_TABLET | Freq: Once | ORAL | Status: AC
  Administered 2023-06-12: 4 mg via ORAL
  Filled 2023-06-12: qty 1

## 2023-06-12 MED ORDER — PHENOL 1.4 % MT LIQD
1.0000 | OROMUCOSAL | Status: DC | PRN
Start: 2023-06-12 — End: 2023-06-13
  Administered 2023-06-12: 1 via OROMUCOSAL
  Filled 2023-06-12: qty 177

## 2023-06-12 MED ORDER — ACETAMINOPHEN 325 MG PO TABS
650.0000 mg | ORAL_TABLET | Freq: Once | ORAL | Status: AC
  Administered 2023-06-12: 650 mg via ORAL
  Filled 2023-06-12: qty 2

## 2023-06-12 MED ORDER — IBUPROFEN 400 MG PO TABS
600.0000 mg | ORAL_TABLET | Freq: Once | ORAL | Status: AC
  Administered 2023-06-12: 600 mg via ORAL
  Filled 2023-06-12: qty 2

## 2023-06-12 NOTE — Discharge Instructions (Addendum)
 Thank you for letting us  evaluate you today.  Today, you are negative for COVID, flu, RSV however you will likely get flu as your children have it (probably so with your husband).  Please make sure to drink plenty of fluids, honey, hot tea to maintain hydration.  You can buy over-the-counter Chloraseptic for sore throat.  I provided you with Zofran  prescription to use as needed for nausea.  You may use Tylenol  and ibuprofen  intermittently every 4 hours for pain and/or fever.  Return to emergency department if you have significant worsening symptoms, intractable vomiting

## 2023-06-12 NOTE — ED Triage Notes (Signed)
 Pt with fever today + nausea.  Pt with sick kids at home, one being seen today and one seen yesterday and was flu A +.

## 2023-06-12 NOTE — ED Provider Notes (Signed)
 [place] EMERGENCY DEPARTMENT AT [facility] Provider Note   CSN: [id number] Arrival date & time: 06/12/23  1732     History  Chief Complaint  Patient presents with   Fever    Tonya Knapp is a 43 y.o. female with past medical history of GERD, depression, type 2 diabetes without insulin dependence presents to emergency department for evaluation of headache, bilateral ear pain, sore throat, nausea, productive cough that started yesterday.  She endorses that her children recently were diagnosed with the flu.  She endorses temperature of 100 F last night.  She denies abdominal pain, vomiting, diarrhea.   Fever Associated symptoms: congestion and sore throat   Associated symptoms: no chest pain, no chills, no cough, no diarrhea, no headaches, no nausea and no vomiting      Home Medications Prior to Admission medications   Medication Sig Start Date End Date Taking? Authorizing Provider  ondansetron  (ZOFRAN -ODT) 4 MG disintegrating tablet Take 1 tablet (4 mg total) by mouth every 8 (eight) hours as needed for nausea or vomiting. 06/12/23  Yes Minnie Tinnie BRAVO, PA  Dulaglutide  (TRULICITY ) 0.75 MG/0.5ML SOPN Inject 0.75 mg into the skin once a week. 06/07/22   Zarwolo, Gloria, FNP  glimepiride  (AMARYL ) 2 MG tablet Take 1 tablet (2 mg total) by mouth daily before breakfast. 06/30/22   Zarwolo, Gloria, FNP  icosapent  Ethyl (VASCEPA ) 1 g capsule Take 2 capsules (2 g total) by mouth 2 (two) times daily. 06/07/22   Zarwolo, Gloria, FNP  metFORMIN  (GLUCOPHAGE ) 1000 MG tablet Take 1 tablet (1,000 mg total) by mouth 2 (two) times daily with a meal. 06/07/22   Zarwolo, Gloria, FNP  ondansetron  (ZOFRAN ) 4 MG tablet Take 1 tablet (4 mg total) by mouth every 8 (eight) hours as needed for nausea or vomiting. 05/23/22   Zarwolo, Gloria, FNP  rosuvastatin  (CRESTOR ) 20 MG tablet Take 1 tablet (20 mg total) by mouth daily. 06/07/22   Zarwolo, Gloria, FNP  sertraline  (ZOLOFT ) 25 MG tablet Take 1 tablet (25  mg total) by mouth daily. 09/12/21   Zarwolo, Gloria, FNP  tretinoin  (RETIN-A ) 0.025 % cream Apply to acne lesions 20-30 min after cleansing the skin once daily before bedtime or in the evening 06/30/22   Zarwolo, Gloria, FNP  Vitamin D , Ergocalciferol , (DRISDOL ) 1.25 MG (50000 UNIT) CAPS capsule Take 1 capsule (50,000 Units total) by mouth every 7 (seven) days. 06/07/22   Zarwolo, Gloria, FNP      Allergies    Hydrocodone    Review of Systems   Review of Systems  Constitutional:  Positive for fever. Negative for chills and fatigue.  HENT:  Positive for congestion and sore throat. Negative for trouble swallowing and voice change.   Respiratory:  Negative for cough, chest tightness, shortness of breath and wheezing.   Cardiovascular:  Negative for chest pain and palpitations.  Gastrointestinal:  Negative for abdominal pain, constipation, diarrhea, nausea and vomiting.  Neurological:  Negative for dizziness, seizures, weakness, light-headedness, numbness and headaches.    Physical Exam Updated Vital Signs BP (!) 143/90 (BP Location: Right Arm)   Pulse 70   Temp 98.4 F (36.9 C) (Oral)   Resp 18   Ht 4' 7 (1.397 m)   Wt 68 kg   SpO2 98%   BMI 34.86 kg/m  Physical Exam Vitals and nursing note reviewed.  Constitutional:      General: She is not in acute distress.    Appearance: Normal appearance. She is not ill-appearing.  Comments: Overall well-appearing  HENT:     Head: Normocephalic and atraumatic.     Right Ear: Tympanic membrane, ear canal and external ear normal.     Left Ear: Tympanic membrane, ear canal and external ear normal.     Mouth/Throat:     Mouth: Mucous membranes are moist.     Pharynx: No oropharyngeal exudate or posterior oropharyngeal erythema.     Comments: Uvula midline. No abscess, fluctuance, erythema noted in mouth Eyes:     General: No scleral icterus.       Right eye: No discharge.        Left eye: No discharge.     Conjunctiva/sclera:  Conjunctivae normal.  Neck:     Comments: No meningismus, masses, nor tender neck Cardiovascular:     Rate and Rhythm: Normal rate.     Pulses: Normal pulses.  Pulmonary:     Effort: Pulmonary effort is normal. No respiratory distress.     Breath sounds: Normal breath sounds. No stridor. No wheezing or rhonchi.     Comments: In full and complete sentences without difficulty, tachypnea, nor hypoxia Chest:     Chest wall: No tenderness.  Abdominal:     General: There is no distension.     Palpations: Abdomen is soft. There is no mass.     Tenderness: There is no abdominal tenderness. There is no guarding.  Musculoskeletal:     Cervical back: Normal range of motion and neck supple. No rigidity or tenderness.  Lymphadenopathy:     Cervical: No cervical adenopathy.  Skin:    General: Skin is warm.     Capillary Refill: Capillary refill takes less than 2 seconds.     Coloration: Skin is not jaundiced or pale.  Neurological:     Mental Status: She is alert and oriented to person, place, and time. Mental status is at baseline.     ED Results / Procedures / Treatments   Labs (all labs ordered are listed, but only abnormal results are displayed) Labs Reviewed  RESP PANEL BY RT-PCR (RSV, FLU A&B, COVID)  [id number]    EKG None  Radiology No results found.  Procedures Procedures    Medications Ordered in ED Medications  phenol (CHLORASEPTIC) mouth spray 1 spray (has no administration in time range)  acetaminophen  (TYLENOL ) tablet 650 mg (has no administration in time range)  ondansetron  (ZOFRAN -ODT) disintegrating tablet 4 mg (4 mg Oral Given 06/12/23 2146)  ibuprofen  (ADVIL ) tablet 600 mg (600 mg Oral Given 06/12/23 2145)    ED Course/ Medical Decision Making/ A&P                                 Medical Decision Making Risk OTC drugs. Prescription drug management.   Patient presents to the ED for concern of nasal congestion, cough, headache, ear pain, sore throat, this  involves an extensive number of treatment options, and is a complaint that carries with it a high risk of complications and morbidity.  The differential diagnosis includes COVID, flu, RSV, URI, pneumonia   Co morbidities that complicate the patient evaluation  DMII   Additional history obtained:  Additional history obtained from Nursing   External records from outside source obtained and reviewed including  Triage RN note   Lab Tests:  I Ordered, and personally interpreted labs.  The pertinent results include: Resp panel negative    Medicines ordered and prescription drug management:  I ordered medication including Zofran , ibuprofen , Tylenol , Chloraseptic for, pain, sore throat Reevaluation of the patient after these medicines showed that the patient improved I have reviewed the patients home medicines and have made adjustments as needed   Test Considered:  Strep, cxr Throat is at most mildly erythematous without abscess, tonsillar exudate, uvular deviation.  I think strep is unlikely as she has not been exposed I considered chest x-ray however symptoms are started yesterday, lung sounds CTAB    Problem List / ED Course:  Viral upper respiratory tract infection Likely influenza as her son in the same room just was diagnosed with influenza but as symptoms started yesterday may be too early and may have too low of a viral load  Regardless, management is still the same.  I discussed importance of oral hydration and using Tylenol  and ibuprofen  intermittently for myalgia and fever as needed Provided prescription for Zofran  as she notes some nausea Discussed return to emergency department cautions with patient expressed understanding agrees with plan.  All questions answered to her satisfaction.  She is agreeable discharge.   Reevaluation:  After the interventions noted above, I reevaluated the patient and found that they have :improved   Social Determinants of  Health:  Provided PCP recommendation   Dispostion:  After consideration of the diagnostic results and the patients response to treatment, I feel that the patent would benefit from outpatient management with symptomatic care.    Final Clinical Impression(s) / ED Diagnoses Final diagnoses:  Viral upper respiratory tract infection    Rx / DC Orders ED Discharge Orders          Ordered    ondansetron  (ZOFRAN -ODT) 4 MG disintegrating tablet  Every 8 hours PRN        06/12/23 2138              Minnie Tinnie BRAVO, PA 06/12/23 2221    Patsey Lot, MD 06/13/23 [phone]

## 2023-06-30 ENCOUNTER — Emergency Department (HOSPITAL_COMMUNITY)
Admission: EM | Admit: 2023-06-30 | Discharge: 2023-06-30 | Disposition: A | Discharge status: Home / Self Care | Payer: BC Managed Care – PPO | Attending: Emergency Medicine | Admitting: Emergency Medicine

## 2023-06-30 ENCOUNTER — Other Ambulatory Visit: Payer: Self-pay

## 2023-06-30 ENCOUNTER — Emergency Department (HOSPITAL_COMMUNITY): Payer: BC Managed Care – PPO

## 2023-06-30 ENCOUNTER — Encounter (HOSPITAL_COMMUNITY): Payer: Self-pay

## 2023-06-30 DIAGNOSIS — M25562 Pain in left knee: Secondary | ICD-10-CM | POA: Diagnosis not present

## 2023-06-30 MED ORDER — OXYCODONE-ACETAMINOPHEN 5-325 MG PO TABS
1.0000 | ORAL_TABLET | Freq: Once | ORAL | Status: AC
  Administered 2023-06-30: 1 via ORAL
  Filled 2023-06-30: qty 1

## 2023-06-30 NOTE — ED Provider Notes (Signed)
 [place] EMERGENCY DEPARTMENT AT [facility] Provider Note   CSN: [id number] Arrival date & time: 06/30/23  1640     History  Chief Complaint  Patient presents with   Knee Pain    Tonya Knapp is a 43 y.o. female.  Patient presents with left knee pain. Pain onset one week ago while at work. 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 She does not remember twisting or otherwise injuring her knee. Pain across the anterior aspect of the knee. Patient has been on light duty since initial incident, pain had improved but has now returned. Taking ibuprofen without improvement.  The history is provided by the patient. No language interpreter was used.  Knee Pain Location:  Knee Time since incident:  1 week Knee location:  L knee Pain details:    Quality:  Shooting and throbbing   Severity:  Severe   Onset quality:  Sudden   Duration:  1 week Prior injury to area:  Yes Worsened by:  Bearing weight Associated symptoms: no back pain, no decreased ROM and no fever        Home Medications Prior to Admission medications   Medication Sig Start Date End Date Taking? Authorizing Provider  Dulaglutide (TRULICITY) 0.75 MG/0.5ML SOPN Inject 0.75 mg into the skin once a week. 06/07/22   Gilmore Laroche, FNP  glimepiride (AMARYL) 2 MG tablet Take 1 tablet (2 mg total) by mouth daily before breakfast. 06/30/22   Gilmore Laroche, FNP  icosapent Ethyl (VASCEPA) 1 g capsule Take 2 capsules (2 g total) by mouth 2 (two) times daily. 06/07/22   Gilmore Laroche, FNP  metFORMIN (GLUCOPHAGE) 1000 MG tablet Take 1 tablet (1,000 mg total) by mouth 2 (two) times daily with a meal. 06/07/22   Gilmore Laroche, FNP  ondansetron (ZOFRAN) 4 MG tablet Take 1 tablet (4 mg total) by mouth every 8 (eight) hours as needed for nausea or vomiting. 05/23/22   Gilmore Laroche, FNP  ondansetron (ZOFRAN-ODT) 4 MG disintegrating tablet Take 1 tablet (4 mg total) by mouth every 8 (eight) hours as needed  for nausea or vomiting. 06/12/23   Judithann Sheen, PA  rosuvastatin (CRESTOR) 20 MG tablet Take 1 tablet (20 mg total) by mouth daily. 06/07/22   Gilmore Laroche, FNP  sertraline (ZOLOFT) 25 MG tablet Take 1 tablet (25 mg total) by mouth daily. 09/12/21   Gilmore Laroche, FNP  tretinoin (RETIN-A) 0.025 % cream Apply to acne lesions 20-30 min after cleansing the skin once daily before bedtime or in the evening 06/30/22   Gilmore Laroche, FNP  Vitamin D, Ergocalciferol, (DRISDOL) 1.25 MG (50000 UNIT) CAPS capsule Take 1 capsule (50,000 Units total) by mouth every 7 (seven) days. 06/07/22   Gilmore Laroche, FNP      Allergies    Hydrocodone    Review of Systems   Review of Systems  Constitutional:  Negative for fever.  Musculoskeletal:  Positive for arthralgias. Negative for back pain.  All other systems reviewed and are negative.   Physical Exam Updated Vital Signs BP (!) 139/104 (BP Location: Right Arm)   Pulse 94   Temp 98.7 F (37.1 C) (Oral)   Resp 17   Ht 4\' 7"  (1.397 m)   Wt 65.8 kg   LMP 06/08/2023   SpO2 99%   BMI 33.70 kg/m  Physical Exam Vitals and nursing note reviewed.  Constitutional:      Appearance: Normal appearance.  HENT:     Head: Normocephalic.  Eyes:  Conjunctiva/sclera: Conjunctivae normal.  Cardiovascular:     Rate and Rhythm: Normal rate.     Pulses: Normal pulses.  Pulmonary:     Effort: Pulmonary effort is normal.  Musculoskeletal:        General: Tenderness present. No deformity. Normal range of motion.     Cervical back: Normal range of motion.  Skin:    General: Skin is warm and dry.  Neurological:     Mental Status: She is alert and oriented to person, place, and time.  Psychiatric:        Mood and Affect: Mood normal.        Behavior: Behavior normal.     ED Results / Procedures / Treatments   Labs (all labs ordered are listed, but only abnormal results are displayed) Labs Reviewed - No data to display  EKG None  Radiology DG  Knee Complete 4 Views Left Result Date: 06/30/2023 CLINICAL DATA:  No known injury.  Knee pain. EXAM: LEFT KNEE - COMPLETE 4+ VIEW COMPARISON:  None Available. FINDINGS: No evidence of fracture, dislocation, or joint effusion. No evidence of arthropathy or other focal bone abnormality. Soft tissues are unremarkable. IMPRESSION: Negative. Electronically Signed   By: Darliss Cheney M.D.   On: 06/30/2023 21:32    Procedures Procedures    Medications Ordered in ED Medications  oxyCODONE-acetaminophen (PERCOCET/ROXICET) 5-325 MG per tablet 1 tablet (1 tablet Oral Given 06/30/23 2237)    ED Course/ Medical Decision Making/ A&P                                 Medical Decision Making Amount and/or Complexity of Data Reviewed Radiology: ordered.  Risk Prescription drug management.   Patient X-Ray negative for obvious fracture or dislocation.  Pt advised to follow up with orthopedics. Patient has knee sleeve at home, will add crutches, conservative therapy recommended and discussed. Patient will be discharged home & is agreeable with above plan. Returns precautions discussed. Pt appears safe for discharge.         Final Clinical Impression(s) / ED Diagnoses Final diagnoses:  Acute pain of left knee    Rx / DC Orders ED Discharge Orders     None         Felicie Morn, NP 06/30/23 2357    Derwood Kaplan, MD 07/01/23 [phone]

## 2023-06-30 NOTE — Discharge Instructions (Addendum)
 Please refer to the attached instructions. Alternate ibuprofen, 600 mg (3 over the counter tablets) with tylenol 1000 mg (2 over the counter 500 mg tablets) every 4 hours. Follow-up with orthopedics if continuing symptoms despite treatment plan.

## 2023-06-30 NOTE — ED Triage Notes (Signed)
 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 Was seen at [facility] had XRay that was negative. Pt states that she has since been on light duty and been wearing a brace on the left knee.

## 2023-07-07 DIAGNOSIS — M7652 Patellar tendinitis, left knee: Secondary | ICD-10-CM | POA: Diagnosis not present

## 2023-07-14 ENCOUNTER — Emergency Department (HOSPITAL_COMMUNITY)

## 2023-07-14 ENCOUNTER — Other Ambulatory Visit: Payer: Self-pay

## 2023-07-14 ENCOUNTER — Emergency Department (HOSPITAL_COMMUNITY)
Admission: EM | Admit: 2023-07-14 | Discharge: 2023-07-14 | Disposition: A | Discharge status: Home / Self Care | Attending: Emergency Medicine | Admitting: Emergency Medicine

## 2023-07-14 ENCOUNTER — Encounter (HOSPITAL_COMMUNITY): Payer: Self-pay | Admitting: Emergency Medicine

## 2023-07-14 DIAGNOSIS — M25562 Pain in left knee: Secondary | ICD-10-CM | POA: Diagnosis not present

## 2023-07-14 DIAGNOSIS — Y92410 Unspecified street and highway as the place of occurrence of the external cause: Secondary | ICD-10-CM | POA: Diagnosis not present

## 2023-07-14 DIAGNOSIS — M25572 Pain in left ankle and joints of left foot: Secondary | ICD-10-CM | POA: Diagnosis not present

## 2023-07-14 DIAGNOSIS — S8002XA Contusion of left knee, initial encounter: Secondary | ICD-10-CM | POA: Diagnosis not present

## 2023-07-14 DIAGNOSIS — S8992XA Unspecified injury of left lower leg, initial encounter: Secondary | ICD-10-CM | POA: Diagnosis not present

## 2023-07-14 MED ORDER — OXYCODONE-ACETAMINOPHEN 5-325 MG PO TABS
1.0000 | ORAL_TABLET | Freq: Once | ORAL | Status: AC
  Administered 2023-07-14: 1 via ORAL
  Filled 2023-07-14: qty 1

## 2023-07-14 MED ORDER — OXYCODONE-ACETAMINOPHEN 5-325 MG PO TABS: 1.0000 | ORAL_TABLET | Freq: Four times a day (QID) | ORAL | 0 refills | Status: AC | PRN
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
## 2023-07-17 DIAGNOSIS — M25562 Pain in left knee: Secondary | ICD-10-CM | POA: Diagnosis not present

## 2023-08-07 DIAGNOSIS — M25562 Pain in left knee: Secondary | ICD-10-CM | POA: Diagnosis not present

## 2023-10-06 DIAGNOSIS — M25562 Pain in left knee: Secondary | ICD-10-CM | POA: Diagnosis not present
